# Patient Record
Sex: Female | Born: 1955 | Race: White | Hispanic: No | Marital: Married | Smoking: Never smoker
Health system: Southern US, Community
[De-identification: ages and names within clinical notes are randomized; demographics above are authoritative.]

## PROBLEM LIST (undated history)

## (undated) DIAGNOSIS — H9191 Unspecified hearing loss, right ear: Secondary | ICD-10-CM

## (undated) DIAGNOSIS — E119 Type 2 diabetes mellitus without complications: Secondary | ICD-10-CM

## (undated) DIAGNOSIS — H409 Unspecified glaucoma: Secondary | ICD-10-CM

## (undated) DIAGNOSIS — D649 Anemia, unspecified: Secondary | ICD-10-CM

## (undated) DIAGNOSIS — I10 Essential (primary) hypertension: Secondary | ICD-10-CM

## (undated) DIAGNOSIS — M1990 Unspecified osteoarthritis, unspecified site: Secondary | ICD-10-CM

## (undated) DIAGNOSIS — C189 Malignant neoplasm of colon, unspecified: Secondary | ICD-10-CM

## (undated) DIAGNOSIS — R7303 Prediabetes: Secondary | ICD-10-CM

## (undated) DIAGNOSIS — K602 Anal fissure, unspecified: Secondary | ICD-10-CM

## (undated) HISTORY — DX: Anal fissure, unspecified: K60.2

## (undated) HISTORY — PX: MENISCUS REPAIR: SHX5179

## (undated) HISTORY — DX: Unspecified glaucoma: H40.9

## (undated) HISTORY — DX: Type 2 diabetes mellitus without complications: E11.9

## (undated) HISTORY — DX: Anemia, unspecified: D64.9

## (undated) HISTORY — DX: Unspecified osteoarthritis, unspecified site: M19.90

## (undated) HISTORY — DX: Essential (primary) hypertension: I10

---
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
## 1978-09-18 HISTORY — PX: TUBAL LIGATION: SHX77

## 2004-08-17 ENCOUNTER — Ambulatory Visit: Payer: Self-pay | Admitting: Family Medicine

## 2004-08-31 ENCOUNTER — Ambulatory Visit: Payer: Self-pay | Admitting: Family Medicine

## 2004-09-22 ENCOUNTER — Ambulatory Visit: Payer: Self-pay | Admitting: Family Medicine

## 2017-12-25 ENCOUNTER — Encounter: Payer: Self-pay | Admitting: Gastroenterology

## 2018-01-25 ENCOUNTER — Ambulatory Visit (INDEPENDENT_AMBULATORY_CARE_PROVIDER_SITE_OTHER): Payer: BLUE CROSS/BLUE SHIELD | Admitting: Gastroenterology

## 2018-01-25 ENCOUNTER — Encounter: Payer: Self-pay | Admitting: Gastroenterology

## 2018-01-25 VITALS — BP 126/84 | Ht 66.0 in | Wt 172.0 lb

## 2018-01-25 DIAGNOSIS — Z1211 Encounter for screening for malignant neoplasm of colon: Secondary | ICD-10-CM

## 2018-01-25 MED ORDER — SOD PICOSULFATE-MAG OX-CIT ACD 10-3.5-12 MG-GM -GM/160ML PO SOLN: 1.0000 | Freq: Once | ORAL | 0 refills | Status: AC

## 2018-01-25 NOTE — Patient Instructions (Signed)
If you are age 64 or older, your body mass index should be between 23-30. Your Body mass index is 27.76 kg/m. If this is out of the aforementioned range listed, please consider follow up with your Primary Care Provider.  If you are age 68 or younger, your body mass index should be between 19-25. Your Body mass index is 27.76 kg/m. If this is out of the aformentioned range listed, please consider follow up with your Primary Care Provider.   You have been scheduled for a colonoscopy. Please follow written instructions given to you at your visit today.  Please pick up your prep supplies at the pharmacy within the next 1-3 days. If you use inhalers (even only as needed), please bring them with you on the day of your procedure. Your physician has requested that you go to www.startemmi.com and enter the access code given to you at your visit today. This web site gives a general overview about your procedure. However, you should still follow specific instructions given to you by our office regarding your preparation for the procedure.  We have sent the following medications to your pharmacy for you to pick up at your convenience: Clenpiq  Thank you,  Dr. Jackquline Denmark

## 2018-01-25 NOTE — Progress Notes (Signed)
Chief Complaint: For screening  Referring Provider:  No ref. provider found      ASSESSMENT AND PLAN;   #1. Colorectal cancer screening  #2. FH of polyps (age 62)  - Proceed with colonoscopy.  I have discussed the risks and benefits.  The risks including risk of perforation requiring laparotomy, bleeding after polypectomy requiring blood transfusions and risks of anesthesia/sedation were discussed.  Rare risks of missing colorectal neoplasms were also discussed.  Alternatives were given.  Patient is fully aware and agrees to proceed. All the questions were answered. Colonoscopy will be scheduled in upcoming days.  Patient is to report immediately if there is any significant weight loss or excessive bleeding until then. Consent forms were given for review.    HPI:   62yr old  No nausea, vomiting, heartburn, regurgitation, odynophagia or dysphagia.  No significant diarrhea or constipation.  There is no melena or hematochezia. No unintentional weight loss.  Long standing constipation, better since she had divorce, occ pellet like stools with rare blood over those, attributed to hoids.  Occ Bloating.  Advised screening colonoscopy.  Her dad had polyps around this age.   Past Medical History:  Diagnosis Date  . Anal fissure s/p silver nit 1965  . Anemia   . Hypertension     Past Surgical History:  Procedure Laterality Date  . APPENDECTOMY  1974  . MENISCUS REPAIR Right   . TUBAL LIGATION  1980    Family History  Problem Relation Age of Onset  . Heart disease Mother   . Colon polyps Father   . Cirrhosis Father     Social History   Tobacco Use  . Smoking status: Never Smoker  . Smokeless tobacco: Never Used  Substance Use Topics  . Alcohol use: Not Currently  . Drug use: Never    Current Outpatient Medications  Medication Sig Dispense Refill  . lisinopril-hydrochlorothiazide (PRINZIDE,ZESTORETIC) 20-12.5 MG tablet Take 1 tablet by mouth daily.     No  current facility-administered medications for this visit.     Allergies  Allergen Reactions  . Codeine Nausea Only    Review of Systems:  Constitutional: Denies fever, chills, diaphoresis, appetite change and fatigue.  HEENT: Denies photophobia, eye pain, redness, hearing loss, ear pain, congestion, sore throat, rhinorrhea, sneezing, mouth sores, neck pain, neck stiffness and tinnitus.   Respiratory: Denies SOB, DOE, cough, chest tightness,  and wheezing.   Cardiovascular: Denies chest pain, palpitations and leg swelling.  Genitourinary: Denies dysuria, urgency, frequency, hematuria, flank pain and difficulty urinating.  Musculoskeletal: Denies myalgias, back pain, joint swelling, arthralgias and gait problem.  Skin: No rash.  Neurological: Denies dizziness, seizures, syncope, weakness, light-headedness, numbness and headaches.  Hematological: Denies adenopathy. Easy bruising, personal or family bleeding history  Psychiatric/Behavioral: No anxiety or depression     Physical Exam:    BP 126/84   Ht 5\' 6"  (1.676 m)   Wt 172 lb (78 kg)   BMI 27.76 kg/m  Filed Weights   01/25/18 1335  Weight: 172 lb (78 kg)   Constitutional:  Well-developed, in no acute distress. Psychiatric: Normal mood and affect. Behavior is normal. HEENT: Pupils normal.  Conjunctivae are normal. No scleral icterus. Neck supple.  Cardiovascular: Normal rate, regular rhythm. No edema Pulmonary/chest: Effort normal and breath sounds normal. No wheezing, rales or rhonchi. Abdominal: Soft, nondistended. Nontender. Bowel sounds active throughout. There are no masses palpable. No hepatomegaly. Rectal:  defered Neurological: Alert and oriented to person place and time. Skin:  Skin is warm and dry. No rashes noted.   Carmell Austria, MD   Cc: No ref. provider found

## 2018-02-04 ENCOUNTER — Encounter: Payer: Self-pay | Admitting: Gastroenterology

## 2018-02-15 ENCOUNTER — Encounter: Payer: Self-pay | Admitting: Gastroenterology

## 2018-02-15 ENCOUNTER — Other Ambulatory Visit: Payer: Self-pay

## 2018-02-15 ENCOUNTER — Ambulatory Visit (AMBULATORY_SURGERY_CENTER): Payer: BLUE CROSS/BLUE SHIELD | Admitting: Gastroenterology

## 2018-02-15 VITALS — BP 121/66 | HR 77 | Temp 97.8°F | Resp 19 | Ht 66.0 in | Wt 172.0 lb

## 2018-02-15 DIAGNOSIS — D125 Benign neoplasm of sigmoid colon: Secondary | ICD-10-CM

## 2018-02-15 DIAGNOSIS — K635 Polyp of colon: Secondary | ICD-10-CM

## 2018-02-15 DIAGNOSIS — D122 Benign neoplasm of ascending colon: Secondary | ICD-10-CM | POA: Diagnosis not present

## 2018-02-15 DIAGNOSIS — Z1211 Encounter for screening for malignant neoplasm of colon: Secondary | ICD-10-CM | POA: Diagnosis present

## 2018-02-15 DIAGNOSIS — D128 Benign neoplasm of rectum: Secondary | ICD-10-CM

## 2018-02-15 DIAGNOSIS — C19 Malignant neoplasm of rectosigmoid junction: Secondary | ICD-10-CM | POA: Diagnosis not present

## 2018-02-15 DIAGNOSIS — K6389 Other specified diseases of intestine: Secondary | ICD-10-CM

## 2018-02-15 MED ORDER — SODIUM CHLORIDE 0.9 % IV SOLN: 500.0000 mL | Freq: Once | INTRAVENOUS | Status: DC

## 2018-02-15 NOTE — Progress Notes (Signed)
Called to room to assist during endoscopic procedure.  Patient ID and intended procedure confirmed with present staff. Received instructions for my participation in the procedure from the performing physician.  

## 2018-02-15 NOTE — Progress Notes (Signed)
To recovery, report to RN, VSS. 

## 2018-02-15 NOTE — Op Note (Signed)
[facility] Patient Name: Sandra Mckee Procedure Date: 02/15/2018 3:32 PM MRN: [id number] Endoscopist: Jackquline Denmark , MD Age: 62 Referring MD:  Date of Birth: Feb 24, 1956 Gender: Female Account #: [id number] Procedure:                Colonoscopy Indications:              Screening for colorectal malignant neoplasm Medicines:                Monitored Anesthesia Care Procedure:                Pre-Anesthesia Assessment:                           - Prior to the procedure, a History and Physical                            was performed, and patient medications and                            allergies were reviewed. The patient is competent.                            The risks and benefits of the procedure and the                            sedation options and risks were discussed with the                            patient. All questions were answered and informed                            consent was obtained. Patient identification and                            proposed procedure were verified by the physician                            in the procedure room. Mental Status Examination:                            alert and oriented. Prophylactic Antibiotics: The                            patient does not require prophylactic antibiotics.                            Prior Anticoagulants: The patient has taken no                            previous anticoagulant or antiplatelet agents. ASA                            Grade Assessment: II - A patient with mild systemic  disease. After reviewing the risks and benefits,                            the patient was deemed in satisfactory condition to                            undergo the procedure. The anesthesia plan was to                            use monitored anesthesia care (MAC). Immediately                            prior to administration of medications, the patient                             was re-assessed for adequacy to receive sedatives.                            The heart rate, respiratory rate, oxygen                            saturations, blood pressure, adequacy of pulmonary                            ventilation, and response to care were monitored                            throughout the procedure. The physical status of                            the patient was re-assessed after the procedure.                           After obtaining informed consent, the colonoscope                            was passed under direct vision. Throughout the                            procedure, the patient's blood pressure, pulse, and                            oxygen saturations were monitored continuously. The                            Colonoscope was introduced through the anus and                            advanced to the the cecum, identified by                            appendiceal orifice and ileocecal valve. The  colonoscopy was performed without difficulty. The                            patient tolerated the procedure well. The quality                            of the bowel preparation was good. Scope In: 3:45:04 PM Scope Out: 4:11:19 PM Scope Withdrawal Time: 0 hours 19 minutes 57 seconds  Total Procedure Duration: 0 hours 26 minutes 15 seconds  Findings:                 An ulcerated partially obstructing, 4 cm                            circumferential was found in the recto-sigmoid                            colon extending from 15 cm to 19 cm. The pediatric                            colonoscope (with diameter 11.2 mm could barely be                            passed beyond). This was friable. This was biopsied                            with a cold forceps for histology. Distal margin                            was tattooed with an injection of 3 mL of [place]                            ink. The mass was friable and would bleed  easily.                           A 4 mm polyp was found in the ascending colon. The                            polyp was sessile. The polyp was removed with a                            cold biopsy forceps. Resection and retrieval were                            complete.                           A 6 mm polyp was found in the sigmoid colon. The                            polyp was sessile. The polyp was removed with a  cold snare. Resection and retrieval were complete.                           A 10 mm polyp was found in the rectum. The polyp                            was sessile. The polyp was removed with a hot                            snare. Resection and retrieval were complete.                           A few small-mouthed diverticula were found in the                            sigmoid colon.                           Non-bleeding internal hemorrhoids were found. The                            hemorrhoids were small. Complications:            No immediate complications. Estimated Blood Loss:     Estimated blood loss was minimal. Impression:               - Malignant Recto-sigmoid mass. (Biopsied and                            Tattooed)                           - Colonic polyps status post polypectomy.                           - Mild sigmoid diverticulosis                           - Non-bleeding internal hemorrhoids. Recommendation:           - Resume previous diet.                           - Continue present medications.                           - Await pathology results.                           - Once biopsies are back, will check CBC, CMP, CEA.                            Followed by CT scan of the chest abdomen and                            pelvis. She would also need oncology and surgical  consultation. Jackquline Denmark, MD 02/15/2018 4:31:03 PM This report has been signed electronically.

## 2018-02-15 NOTE — Patient Instructions (Signed)
YOU HAD AN ENDOSCOPIC PROCEDURE TODAY AT [facility]:   Refer to the procedure report that was given to you for any specific questions about what was found during the examination.  If the procedure report does not answer your questions, please call your gastroenterologist to clarify.  If you requested that your care partner not be given the details of your procedure findings, then the procedure report has been included in a sealed envelope for you to review at your convenience later.  YOU SHOULD EXPECT: Some feelings of bloating in the abdomen. Passage of more gas than usual.  Walking can help get rid of the air that was put into your GI tract during the procedure and reduce the bloating. If you had a lower endoscopy (such as a colonoscopy or flexible sigmoidoscopy) you may notice spotting of blood in your stool or on the toilet paper. If you underwent a bowel prep for your procedure, you may not have a normal bowel movement for a few days.  Please Note:  You might notice some irritation and congestion in your nose or some drainage.  This is from the oxygen used during your procedure.  There is no need for concern and it should clear up in a day or so.  SYMPTOMS TO REPORT IMMEDIATELY:   Following lower endoscopy (colonoscopy or flexible sigmoidoscopy):  Excessive amounts of blood in the stool  Significant tenderness or worsening of abdominal pains  Swelling of the abdomen that is new, acute  Fever of 100F or higher   For urgent or emergent issues, a gastroenterologist can be reached at any hour by calling [phone].   DIET:  We do recommend a small meal at first, but then you may proceed to your regular diet.  Drink plenty of fluids but you should avoid alcoholic beverages for 24 hours.  ACTIVITY:  You should plan to take it easy for the rest of today and you should NOT DRIVE or use heavy machinery until tomorrow (because of the sedation medicines used during the test).     FOLLOW UP: Our staff will call the number listed on your records the next business day following your procedure to check on you and address any questions or concerns that you may have regarding the information given to you following your procedure. If we do not reach you, we will leave a message.  However, if you are feeling well and you are not experiencing any problems, there is no need to return our call.  We will assume that you have returned to your regular daily activities without incident.  If any biopsies were taken you will be contacted by phone or by letter within the next 1-3 weeks.  Please call us at [phone] if you have not heard about the biopsies in 3 weeks.    SIGNATURES/CONFIDENTIALITY: You and/or your care partner have signed paperwork which will be entered into your electronic medical record.  These signatures attest to the fact that that the information above on your After Visit Summary has been reviewed and is understood.  Full responsibility of the confidentiality of this discharge information lies with you and/or your care-partner.  We will see what the next steps are before we take any other action.

## 2018-02-15 NOTE — Progress Notes (Signed)
Specimens sent [facility] called at [facility].

## 2018-02-18 ENCOUNTER — Other Ambulatory Visit: Payer: Self-pay

## 2018-02-18 ENCOUNTER — Telehealth: Payer: Self-pay

## 2018-02-18 DIAGNOSIS — K6389 Other specified diseases of intestine: Secondary | ICD-10-CM

## 2018-02-18 NOTE — Telephone Encounter (Signed)
  Follow up Call-  Call back number 02/15/2018  Post procedure Call Back phone  # [phone]  Permission to leave phone message Yes  Some recent data might be hidden     Patient questions:  Do you have a fever, pain , or abdominal swelling? No. Pain Score  0 *  Have you tolerated food without any problems? Yes.    Have you been able to return to your normal activities? Yes.    Do you have any questions about your discharge instructions: Diet   No. Medications  No. Follow up visit  No.  Do you have questions or concerns about your Care? Yes.    Actions: * If pain score is 4 or above: No action needed, pain <4.  Pt. Did wonder when she would hear back from her biopsy results.   Told pt. If she has not heard by Friday morning, to call and check.

## 2018-02-18 NOTE — Telephone Encounter (Signed)
Pt is returning your call and said she is doing good °

## 2018-02-18 NOTE — Telephone Encounter (Signed)
Left message

## 2018-02-19 ENCOUNTER — Telehealth: Payer: Self-pay | Admitting: Gastroenterology

## 2018-02-19 NOTE — Telephone Encounter (Signed)
Left message yesterday and today to call back regarding pathology. Have left my cell phone no as well

## 2018-02-20 ENCOUNTER — Other Ambulatory Visit: Payer: Self-pay

## 2018-02-20 ENCOUNTER — Telehealth: Payer: Self-pay

## 2018-02-20 DIAGNOSIS — K6389 Other specified diseases of intestine: Secondary | ICD-10-CM

## 2018-02-20 NOTE — Progress Notes (Signed)
che

## 2018-02-20 NOTE — Telephone Encounter (Signed)
CT scans ordered for 02/28/18 at 10:00 am with arrival at 9:45.  She is to have lab done here and will pick up the contrast here at the office.  She verbalized understanding of the instructions, first bottle at 8am and second at 9am, NPO for 4 hours prior to test.

## 2018-02-22 ENCOUNTER — Other Ambulatory Visit (INDEPENDENT_AMBULATORY_CARE_PROVIDER_SITE_OTHER): Payer: BLUE CROSS/BLUE SHIELD

## 2018-02-22 DIAGNOSIS — K639 Disease of intestine, unspecified: Secondary | ICD-10-CM | POA: Diagnosis not present

## 2018-02-22 DIAGNOSIS — K6389 Other specified diseases of intestine: Secondary | ICD-10-CM

## 2018-02-22 LAB — CBC WITH DIFFERENTIAL/PLATELET
BASOS ABS: 0 10*3/uL (ref 0.0–0.1)
BASOS PCT: 0.6 % (ref 0.0–3.0)
EOS ABS: 0.3 10*3/uL (ref 0.0–0.7)
Eosinophils Relative: 5.3 % — ABNORMAL HIGH (ref 0.0–5.0)
HCT: 43 % (ref 36.0–46.0)
HEMOGLOBIN: 14.6 g/dL (ref 12.0–15.0)
LYMPHS PCT: 21.8 % (ref 12.0–46.0)
Lymphs Abs: 1.4 10*3/uL (ref 0.7–4.0)
MCHC: 33.9 g/dL (ref 30.0–36.0)
MCV: 91.1 fl (ref 78.0–100.0)
MONO ABS: 0.6 10*3/uL (ref 0.1–1.0)
Monocytes Relative: 9.5 % (ref 3.0–12.0)
Neutro Abs: 4 10*3/uL (ref 1.4–7.7)
Neutrophils Relative %: 62.8 % (ref 43.0–77.0)
Platelets: 257 10*3/uL (ref 150.0–400.0)
RBC: 4.72 Mil/uL (ref 3.87–5.11)
RDW: 13.9 % (ref 11.5–15.5)
WBC: 6.3 10*3/uL (ref 4.0–10.5)

## 2018-02-22 LAB — COMPREHENSIVE METABOLIC PANEL
ALBUMIN: 4.4 g/dL (ref 3.5–5.2)
ALK PHOS: 80 U/L (ref 39–117)
ALT: 18 U/L (ref 0–35)
AST: 39 U/L — ABNORMAL HIGH (ref 0–37)
BILIRUBIN TOTAL: 0.4 mg/dL (ref 0.2–1.2)
BUN: 19 mg/dL (ref 6–23)
CALCIUM: 9.8 mg/dL (ref 8.4–10.5)
CHLORIDE: 101 meq/L (ref 96–112)
CO2: 27 mEq/L (ref 19–32)
CREATININE: 0.88 mg/dL (ref 0.40–1.20)
GFR: 68.75 mL/min (ref >60.00)
Glucose, Bld: 187 mg/dL — ABNORMAL HIGH (ref 70–99)
Potassium: 4.9 mEq/L (ref 3.5–5.1)
Sodium: 138 mEq/L (ref 135–145)
TOTAL PROTEIN: 7.2 g/dL (ref 6.0–8.3)

## 2018-02-25 LAB — CEA: CEA: 1.4 ng/mL

## 2018-02-26 ENCOUNTER — Inpatient Hospital Stay: Payer: BLUE CROSS/BLUE SHIELD

## 2018-02-26 ENCOUNTER — Inpatient Hospital Stay: Payer: BLUE CROSS/BLUE SHIELD | Attending: Hematology & Oncology | Admitting: Hematology & Oncology

## 2018-02-26 ENCOUNTER — Other Ambulatory Visit: Payer: Self-pay

## 2018-02-26 ENCOUNTER — Encounter: Payer: Self-pay | Admitting: Hematology & Oncology

## 2018-02-26 VITALS — BP 113/68 | HR 74 | Temp 98.5°F | Wt 170.8 lb

## 2018-02-26 DIAGNOSIS — C19 Malignant neoplasm of rectosigmoid junction: Secondary | ICD-10-CM | POA: Insufficient documentation

## 2018-02-26 DIAGNOSIS — I10 Essential (primary) hypertension: Secondary | ICD-10-CM | POA: Diagnosis not present

## 2018-02-26 DIAGNOSIS — D123 Benign neoplasm of transverse colon: Secondary | ICD-10-CM

## 2018-02-26 DIAGNOSIS — Z8371 Family history of colonic polyps: Secondary | ICD-10-CM | POA: Diagnosis not present

## 2018-02-26 LAB — CMP (CANCER CENTER ONLY)
ALBUMIN: 4.4 g/dL (ref 3.5–5.0)
ALT: 16 U/L (ref 0–55)
ANION GAP: 9 (ref 3–11)
AST: 37 U/L — ABNORMAL HIGH (ref 5–34)
Alkaline Phosphatase: 91 U/L (ref 40–150)
BILIRUBIN TOTAL: 0.4 mg/dL (ref 0.2–1.2)
BUN: 20 mg/dL (ref 7–26)
CO2: 26 mmol/L (ref 22–29)
Calcium: 10.5 mg/dL — ABNORMAL HIGH (ref 8.4–10.4)
Chloride: 103 mmol/L (ref 98–109)
Creatinine: 0.82 mg/dL (ref 0.60–1.10)
GFR, Est AFR Am: >60 mL/min (ref >60)
GFR, Estimated: >60 mL/min (ref >60)
GLUCOSE: 112 mg/dL (ref 70–140)
POTASSIUM: 4.7 mmol/L (ref 3.5–5.1)
SODIUM: 138 mmol/L (ref 136–145)
TOTAL PROTEIN: 7.5 g/dL (ref 6.4–8.3)

## 2018-02-26 LAB — CBC WITH DIFFERENTIAL (CANCER CENTER ONLY)
BASOS ABS: 0 10*3/uL (ref 0.0–0.1)
BASOS PCT: 1 %
Eosinophils Absolute: 0.2 10*3/uL (ref 0.0–0.5)
Eosinophils Relative: 3 %
HEMATOCRIT: 42.9 % (ref 34.8–46.6)
Hemoglobin: 14.4 g/dL (ref 11.6–15.9)
Lymphocytes Relative: 25 %
Lymphs Abs: 1.9 10*3/uL (ref 0.9–3.3)
MCH: 30.9 pg (ref 26.0–34.0)
MCHC: 33.6 g/dL (ref 32.0–36.0)
MCV: 92.1 fL (ref 81.0–101.0)
MONO ABS: 0.7 10*3/uL (ref 0.1–0.9)
Monocytes Relative: 8 %
NEUTROS ABS: 5 10*3/uL (ref 1.5–6.5)
NEUTROS PCT: 63 %
Platelet Count: 264 10*3/uL (ref 145–400)
RBC: 4.66 MIL/uL (ref 3.70–5.32)
RDW: 13.4 % (ref 11.1–15.7)
WBC Count: 7.8 10*3/uL (ref 3.9–10.0)

## 2018-02-27 ENCOUNTER — Telehealth: Payer: Self-pay | Admitting: *Deleted

## 2018-02-27 LAB — CEA (IN HOUSE-CHCC): CEA (CHCC-IN HOUSE): 1.38 ng/mL (ref 0.00–5.00)

## 2018-02-27 NOTE — Telephone Encounter (Signed)
Per Morton Amy, her CT scans tomorrow do not require a prior authorization.

## 2018-02-27 NOTE — Telephone Encounter (Addendum)
Patient is aware of results  ----- Message from [name], MD sent at 02/27/2018  1:42 PM EDT ----- Call - the tumor level is normal!! I do not think that the cancer has spread!!  Sandra Mckee

## 2018-02-27 NOTE — Progress Notes (Signed)
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 Follow-up  : I have colon cancer.  HPI: Ms. Kerwood is a very nice 62 year old white female.  She really has been quite healthy.  She does have some hyper tension.  She has been having some rectal bleeding for about 4 months.  She has had no pain associated with this.  Is been no weight loss.  She has had no nausea or vomiting.  She ultimately had a colonoscopy.  She has never had a colonoscopy before.  The colonoscopy was done on Feb 15, 2018.  Polyps were noted.  She was noted to have a mass in the rectosigmoid colon.  The pathology report ([id number]) showed fragment of adenocarcinoma in a polyp in the mid sigmoid colon.  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  She is yet to have a CT scan done.  She has not yet seen surgery.  She does not smoke.  There is been no obvious change in bowel or bladder habits.  She has had no diarrhea.  She has had no hematuria.  She has had some occasional urinary urgency.  She is had no leg swelling.  She is had no rashes.  There is been no fever.  There is no history of diabetes.  There is no family history of colon cancer.  She was referred to the [facility] for an evaluation.  Overall, her performance status is ECOG 0.   Past Medical History:  Diagnosis Date  . Anal fissure 1965  . Anemia   . Arthritis    fingers, hand, lower back  . Glaucoma   . Hypertension   :  Past Surgical History:  Procedure Laterality Date  . APPENDECTOMY  1974  . MENISCUS REPAIR Right   . TUBAL LIGATION  1980  :   Current Outpatient Medications:  .  lisinopril-hydrochlorothiazide (PRINZIDE,ZESTORETIC) 20-12.5 MG tablet, Take 1 tablet by mouth daily., Disp: , Rfl:   Current Facility-Administered Medications:  .  0.9 %  sodium chloride infusion, 500 mL, Intravenous, Once,  Jackquline Denmark, MD:  :  Allergies  Allergen Reactions  . Codeine Nausea Only  :  Family History  Problem Relation Age of Onset  . Heart disease Mother   . Colon polyps Father   . Cirrhosis Father   . Colon cancer Neg Hx   . Esophageal cancer Neg Hx   . Prostate cancer Neg Hx   . Rectal cancer Neg Hx   :  Social History   Socioeconomic History  . Marital status: Married    Spouse name: Not on file  . Number of children: Not on file  . Years of education: Not on file  . Highest education level: Not on file  Occupational History  . Not on file  Social Needs  . Financial resource strain: Not on file  . Food insecurity:    Worry: Not on file    Inability: Not on file  . Transportation needs:    Medical: Not on file    Non-medical: Not on file  Tobacco Use  . Smoking status: Never Smoker  . Smokeless tobacco: Never Used  Substance and Sexual Activity  . Alcohol use: Not Currently  . Drug use: Never  . Sexual activity: Not on file  Lifestyle  . Physical activity:    Days per week: Not on file    Minutes per  session: Not on file  . Stress: Not on file  Relationships  . Social connections:    Talks on phone: Not on file    Gets together: Not on file    Attends religious service: Not on file    Active member of club or organization: Not on file    Attends meetings of clubs or organizations: Not on file    Relationship status: Not on file  . Intimate partner violence:    Fear of current or ex partner: Not on file    Emotionally abused: Not on file    Physically abused: Not on file    Forced sexual activity: Not on file  Other Topics Concern  . Not on file  Social History Narrative  . Not on file  :  Review of Systems  Constitutional: Negative.   HENT: Negative.   Eyes: Negative.   Respiratory: Negative.   Cardiovascular: Negative.   Gastrointestinal: Positive for blood in stool.  Genitourinary: Positive for frequency.  Musculoskeletal: Negative.    Skin: Negative.   Neurological: Negative.   Endo/Heme/Allergies: Negative.   Psychiatric/Behavioral: Negative.      Exam: Well-developed well-nourished white female in no obvious distress.  Vital signs show temperature of 98.5.  Pulse 74.  Blood pressure 113/68.  Weight is 171 pounds.  Head neck exam shows no ocular or oral lesions.  There are no palpable cervical or supraclavicular lymph nodes.  Lungs are clear bilaterally.  Cardiac exam regular rate and rhythm with no murmurs, rubs or bruits.  Abdomen is soft.  She has good bowel sounds.  There is no fluid wave.  There is no guarding or rebound tenderness.  She has no palpable liver or spleen tip.  Back exam shows no tenderness over the spine, ribs or hips.  Extremities shows no clubbing, cyanosis or edema.  Skin exam shows no rashes, ecchymoses or petechia.  Neurological exam shows no focal neurological deficits. @IPVITALS @   Recent Labs    02/26/18 1211  WBC 7.8  HGB 14.4  HCT 42.9  PLT 264   Recent Labs    02/26/18 1211  NA 138  K 4.7  CL 103  CO2 26  GLUCOSE 112  BUN 20  CREATININE 0.82  CALCIUM 10.5*    Blood smear review: None  Pathology: See above    Assessment and Plan: Ms. Boling is a very charming 62 year old white female.  She has invasive adenocarcinoma of the rectosigmoid colon.  She has had polyps.  She comes in with her daughter.  Thankfully, we can learn from this.  Her daughter will clearly need to have a colonoscopy before she is 25 in my opinion.  She clearly needs surgery.  She will see the surgeon next week.  I think she is scheduled for a CT scan on Friday.  I would not think that there is any evidence of metastatic disease by her exam and by her labs.  We will have to see what her CEA level is.  The issue of adjuvant therapy will really be dictated by the pathology.  Hopefully, she will not need any adjuvant therapy.  I am amazed that she does not have any symptoms from this tumor outside of  some hematochezia.  By her lack of anemia, the amount of bleeding must be minimal.  We will await the surgical intervention.  Hopefully, she will be able to have surgery within a couple weeks.  I spent a good hour with Ms. Noori and her  daughter.  I spent all the time face-to-face with them. I was counseling them as to the findings and my recommendations.  They understand very well.  She is in great shape so I do not see any problems with being aggressive with surgery.  I likely will see her in the hospital after she has surgery.

## 2018-02-28 ENCOUNTER — Ambulatory Visit (INDEPENDENT_AMBULATORY_CARE_PROVIDER_SITE_OTHER)
Admission: RE | Admit: 2018-02-28 | Discharge: 2018-02-28 | Disposition: A | Discharge status: Home / Self Care | Payer: BLUE CROSS/BLUE SHIELD | Source: Ambulatory Visit | Attending: Gastroenterology | Admitting: Gastroenterology

## 2018-02-28 DIAGNOSIS — K639 Disease of intestine, unspecified: Secondary | ICD-10-CM | POA: Diagnosis not present

## 2018-02-28 DIAGNOSIS — K6389 Other specified diseases of intestine: Secondary | ICD-10-CM

## 2018-02-28 MED ORDER — IOPAMIDOL (ISOVUE-300) INJECTION 61%
100.0000 mL | Freq: Once | INTRAVENOUS | Status: AC | PRN
  Administered 2018-02-28: 100 mL via INTRAVENOUS

## 2018-03-01 ENCOUNTER — Telehealth: Payer: Self-pay | Admitting: Gastroenterology

## 2018-03-01 NOTE — Telephone Encounter (Signed)
I have discussed the results of the CT scan chest abdomen and pelvis with the patient in detail.  I also reviewed the CT myself.  No evidence of any metastatic disease.  Patient has appointment with surgery next week.  She has already seen Dr Marin Olp. She was very happy with Dr. Antonieta Pert visit

## 2018-03-04 ENCOUNTER — Ambulatory Visit: Payer: Self-pay | Admitting: General Surgery

## 2018-03-04 NOTE — H&P (View-Only) (Signed)
History of Present Illness Leighton Ruff MD; 0/48/8891 9:51 AM) The patient is a 62 year old female who presents with colorectal cancer. 62 year old female who presents to the office with complaints of bloody bowel movements. Colonoscopy revealed a rectosigmoid mass and several colon polyps. The mass was able to be traversed with a repeat scope only. Biopsies confirmed adenocarcinoma. CT scans of the chest abdomen and pelvis showed some thickening around the cecum thought to be due to her previous appendectomy. There was also some thickening around the gallbladder. Otherwise mass was shown in the rectosigmoid junction. There were some slightly dilated lymph nodes in the area. She is slightly symptomatic with some mild abdominal cramping. She is having no more bloody bowel movements. She denies any weight loss. Her only surgery is an open appendectomy approximately 35 years ago.   Past Surgical History (Tanisha A. Owens Shark, [place]; 03/04/2018 9:22 AM) Anal Fissure Repair Appendectomy Colon Polyp Removal - Colonoscopy Knee Surgery Right. Oral Surgery  Diagnostic Studies History (Tanisha A. Owens Shark, [place]; 03/04/2018 9:22 AM) Colonoscopy within last year Mammogram never Pap Smear 1-5 years ago  Allergies (Tanisha A. Owens Shark, RMA; 03/04/2018 9:23 AM) Codeine Phosphate *ANALGESICS - OPIOID* Nausea, Vomiting. Allergies Reconciled  Medication History (Tanisha A. Owens Shark, [place]; 03/04/2018 9:23 AM) Lisinopril-Hydrochlorothiazide (20-12.5MG  Tablet, Oral) Active. Medications Reconciled  Social History (Tanisha A. Owens Shark, [place]; 03/04/2018 9:22 AM) Alcohol use Occasional alcohol use. Caffeine use Tea. No drug use Tobacco use Never smoker.  Family History (Tanisha A. Owens Shark, [place]; 03/04/2018 9:22 AM) Colon Polyps Father. Diabetes Mellitus Sister. Heart Disease Mother. Heart disease in female family member before age 22 Hypertension Mother.  Pregnancy / Birth History (Tanisha A. Owens Shark,  [place]; 03/04/2018 9:22 AM) Age at menarche 75 years. Age of menopause 51-55 Contraceptive History Oral contraceptives. Gravida 2 Length (months) of breastfeeding 3-6 Maternal age 73-20 Para 2  Other Problems (Tanisha A. Owens Shark, [place]; 03/04/2018 9:22 AM) Back Pain Bladder Problems Hemorrhoids High blood pressure     Review of Systems (Tanisha A. Brown RMA; 03/04/2018 9:22 AM) General Not Present- Appetite Loss, Chills, Fatigue, Fever, Night Sweats, Weight Gain and Weight Loss. Skin Not Present- Change in Wart/Mole, Dryness, Hives, Jaundice, New Lesions, Non-Healing Wounds, Rash and Ulcer. HEENT Present- Hearing Loss. Not Present- Earache, Hoarseness, Nose Bleed, Oral Ulcers, Ringing in the Ears, Seasonal Allergies, Sinus Pain, Sore Throat, Visual Disturbances, Wears glasses/contact lenses and Yellow Eyes. Respiratory Not Present- Bloody sputum, Chronic Cough, Difficulty Breathing, Snoring and Wheezing. Breast Not Present- Breast Mass, Breast Pain, Nipple Discharge and Skin Changes. Cardiovascular Not Present- Chest Pain, Difficulty Breathing Lying Down, Leg Cramps, Palpitations, Rapid Heart Rate, Shortness of Breath and Swelling of Extremities. Gastrointestinal Present- Indigestion. Not Present- Abdominal Pain, Bloating, Bloody Stool, Change in Bowel Habits, Chronic diarrhea, Constipation, Difficulty Swallowing, Excessive gas, Gets full quickly at meals, Hemorrhoids, Nausea, Rectal Pain and Vomiting. Female Genitourinary Present- Pelvic Pain. Not Present- Frequency, Nocturia, Painful Urination and Urgency. Musculoskeletal Present- Back Pain. Not Present- Joint Pain, Joint Stiffness, Muscle Pain, Muscle Weakness and Swelling of Extremities. Neurological Not Present- Decreased Memory, Fainting, Headaches, Numbness, Seizures, Tingling, Tremor, Trouble walking and Weakness. Psychiatric Not Present- Anxiety, Bipolar, Change in Sleep Pattern, Depression, Fearful and Frequent  crying. Endocrine Not Present- Cold Intolerance, Excessive Hunger, Hair Changes, Heat Intolerance, Hot flashes and New Diabetes. Hematology Not Present- Blood Thinners, Easy Bruising, Excessive bleeding, Gland problems, HIV and Persistent Infections.  Vitals (Tanisha A. Brown RMA; 03/04/2018 9:23 AM) 03/04/2018 9:22 AM Weight: 173.6 lb Height: 66in Body Surface Area:  1.88 m Body Mass Index: 28.02 kg/m  Temp.: 97.17F  Pulse: 89 (Regular)  BP: 122/78 (Sitting, Left Arm, Standard)      Physical Exam Leighton Ruff MD; 0/37/0488 9:52 AM)  General Mental Status-Alert. General Appearance-Not in acute distress. Build & Nutrition-Well nourished. Posture-Normal posture. Gait-Normal.  Head and Neck Head-normocephalic, atraumatic with no lesions or palpable masses. Trachea-midline.  Chest and Lung Exam Chest and lung exam reveals -on auscultation, normal breath sounds, no adventitious sounds and normal vocal resonance.  Cardiovascular Cardiovascular examination reveals -normal heart sounds, regular rate and rhythm with no murmurs and no digital clubbing, cyanosis, edema, increased warmth or tenderness.  Abdomen Inspection Inspection of the abdomen reveals - No Hernias. Palpation/Percussion Palpation and Percussion of the abdomen reveal - Soft, Non Tender, No Rigidity (guarding), No hepatosplenomegaly and No Palpable abdominal masses.  Rectal Anorectal Exam Internal - Note: no masses palpated.  Neurologic Neurologic evaluation reveals -alert and oriented x 3 with no impairment of recent or remote memory, normal attention span and ability to concentrate, normal sensation and normal coordination.  Musculoskeletal Normal Exam - Bilateral-Upper Extremity Strength Normal and Lower Extremity Strength Normal.    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 This was noted in the rectosigmoid junction. It was tattooed distally. On exam today I cannot palpate it digitally. CT scan showed no signs of metastatic disease. We have discussed surgical resection in detail. We will use a minimally invasive approach. The surgery and anatomy were described to the patient as well as the risks of surgery and the possible complications. These include: Bleeding, deep abdominal infections and possible wound complications such as hernia and infection, damage to adjacent structures, leak of surgical connections, which can lead to other surgeries and possibly an ostomy, possible need for other procedures, such as abscess drains in radiology, possible prolonged hospital stay, possible diarrhea from removal of part of the colon, possible constipation from narcotics, possible bowel, bladder or sexual dysfunction if having rectal surgery, prolonged fatigue/weakness or appetite loss, possible early recurrence of of disease, possible complications of their medical problems such as heart disease or arrhythmias or lung problems, death (less than 1%). I believe the patient understands and wishes to proceed with the surgery.

## 2018-03-04 NOTE — H&P (Signed)
History of Present Illness Leighton Ruff MD; 6/50/3546 9:51 AM) The patient is a 62 year old female who presents with colorectal cancer. 62 year old female who presents to the office with complaints of bloody bowel movements. Colonoscopy revealed a rectosigmoid mass and several colon polyps. The mass was able to be traversed with a repeat scope only. Biopsies confirmed adenocarcinoma. CT scans of the chest abdomen and pelvis showed some thickening around the cecum thought to be due to her previous appendectomy. There was also some thickening around the gallbladder. Otherwise mass was shown in the rectosigmoid junction. There were some slightly dilated lymph nodes in the area. She is slightly symptomatic with some mild abdominal cramping. She is having no more bloody bowel movements. She denies any weight loss. Her only surgery is an open appendectomy approximately 35 years ago.   Past Surgical History (Tanisha A. Owens Shark, [place]; 03/04/2018 9:22 AM) Anal Fissure Repair Appendectomy Colon Polyp Removal - Colonoscopy Knee Surgery Right. Oral Surgery  Diagnostic Studies History (Tanisha A. Owens Shark, [place]; 03/04/2018 9:22 AM) Colonoscopy within last year Mammogram never Pap Smear 1-5 years ago  Allergies (Tanisha A. Owens Shark, RMA; 03/04/2018 9:23 AM) Codeine Phosphate *ANALGESICS - OPIOID* Nausea, Vomiting. Allergies Reconciled  Medication History (Tanisha A. Owens Shark, RMA; 03/04/2018 9:23 AM) Lisinopril-Hydrochlorothiazide (20-12.5MG  Tablet, Oral) Active. Medications Reconciled  Social History (Tanisha A. Owens Shark, [place]; 03/04/2018 9:22 AM) Alcohol use Occasional alcohol use. Caffeine use Tea. No drug use Tobacco use Never smoker.  Family History (Tanisha A. Owens Shark, [place]; 03/04/2018 9:22 AM) Colon Polyps Father. Diabetes Mellitus Sister. Heart Disease Mother. Heart disease in female family member before age 43 Hypertension Mother.  Pregnancy / Birth History (Tanisha A. Owens Shark,  [place]; 03/04/2018 9:22 AM) Age at menarche 48 years. Age of menopause 51-55 Contraceptive History Oral contraceptives. Gravida 2 Length (months) of breastfeeding 3-6 Maternal age 79-20 Para 2  Other Problems (Tanisha A. Owens Shark, [place]; 03/04/2018 9:22 AM) Back Pain Bladder Problems Hemorrhoids High blood pressure     Review of Systems (Tanisha A. Brown RMA; 03/04/2018 9:22 AM) General Not Present- Appetite Loss, Chills, Fatigue, Fever, Night Sweats, Weight Gain and Weight Loss. Skin Not Present- Change in Wart/Mole, Dryness, Hives, Jaundice, New Lesions, Non-Healing Wounds, Rash and Ulcer. HEENT Present- Hearing Loss. Not Present- Earache, Hoarseness, Nose Bleed, Oral Ulcers, Ringing in the Ears, Seasonal Allergies, Sinus Pain, Sore Throat, Visual Disturbances, Wears glasses/contact lenses and Yellow Eyes. Respiratory Not Present- Bloody sputum, Chronic Cough, Difficulty Breathing, Snoring and Wheezing. Breast Not Present- Breast Mass, Breast Pain, Nipple Discharge and Skin Changes. Cardiovascular Not Present- Chest Pain, Difficulty Breathing Lying Down, Leg Cramps, Palpitations, Rapid Heart Rate, Shortness of Breath and Swelling of Extremities. Gastrointestinal Present- Indigestion. Not Present- Abdominal Pain, Bloating, Bloody Stool, Change in Bowel Habits, Chronic diarrhea, Constipation, Difficulty Swallowing, Excessive gas, Gets full quickly at meals, Hemorrhoids, Nausea, Rectal Pain and Vomiting. Female Genitourinary Present- Pelvic Pain. Not Present- Frequency, Nocturia, Painful Urination and Urgency. Musculoskeletal Present- Back Pain. Not Present- Joint Pain, Joint Stiffness, Muscle Pain, Muscle Weakness and Swelling of Extremities. Neurological Not Present- Decreased Memory, Fainting, Headaches, Numbness, Seizures, Tingling, Tremor, Trouble walking and Weakness. Psychiatric Not Present- Anxiety, Bipolar, Change in Sleep Pattern, Depression, Fearful and Frequent  crying. Endocrine Not Present- Cold Intolerance, Excessive Hunger, Hair Changes, Heat Intolerance, Hot flashes and New Diabetes. Hematology Not Present- Blood Thinners, Easy Bruising, Excessive bleeding, Gland problems, HIV and Persistent Infections.  Vitals (Tanisha A. Brown RMA; 03/04/2018 9:23 AM) 03/04/2018 9:22 AM Weight: 173.6 lb Height: 66in Body Surface Area:  1.88 m Body Mass Index: 28.02 kg/m  Temp.: 97.26F  Pulse: 89 (Regular)  BP: 122/78 (Sitting, Left Arm, Standard)      Physical Exam Leighton Ruff MD; 7/00/1749 9:52 AM)  General Mental Status-Alert. General Appearance-Not in acute distress. Build & Nutrition-Well nourished. Posture-Normal posture. Gait-Normal.  Head and Neck Head-normocephalic, atraumatic with no lesions or palpable masses. Trachea-midline.  Chest and Lung Exam Chest and lung exam reveals -on auscultation, normal breath sounds, no adventitious sounds and normal vocal resonance.  Cardiovascular Cardiovascular examination reveals -normal heart sounds, regular rate and rhythm with no murmurs and no digital clubbing, cyanosis, edema, increased warmth or tenderness.  Abdomen Inspection Inspection of the abdomen reveals - No Hernias. Palpation/Percussion Palpation and Percussion of the abdomen reveal - Soft, Non Tender, No Rigidity (guarding), No hepatosplenomegaly and No Palpable abdominal masses.  Rectal Anorectal Exam Internal - Note: no masses palpated.  Neurologic Neurologic evaluation reveals -alert and oriented x 3 with no impairment of recent or remote memory, normal attention span and ability to concentrate, normal sensation and normal coordination.  Musculoskeletal Normal Exam - Bilateral-Upper Extremity Strength Normal and Lower Extremity Strength Normal.    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 This was noted in the rectosigmoid junction. It was tattooed distally. On exam today I cannot palpate it digitally. CT scan showed no signs of metastatic disease. We have discussed surgical resection in detail. We will use a minimally invasive approach. The surgery and anatomy were described to the patient as well as the risks of surgery and the possible complications. These include: Bleeding, deep abdominal infections and possible wound complications such as hernia and infection, damage to adjacent structures, leak of surgical connections, which can lead to other surgeries and possibly an ostomy, possible need for other procedures, such as abscess drains in radiology, possible prolonged hospital stay, possible diarrhea from removal of part of the colon, possible constipation from narcotics, possible bowel, bladder or sexual dysfunction if having rectal surgery, prolonged fatigue/weakness or appetite loss, possible early recurrence of of disease, possible complications of their medical problems such as heart disease or arrhythmias or lung problems, death (less than 1%). I believe the patient understands and wishes to proceed with the surgery.

## 2018-03-14 NOTE — Patient Instructions (Addendum)
Sandra Mckee  03/14/2018   Your procedure is scheduled on: 03-27-18   Report to [facility] Main  Entrance    Report to admitting at 6:30AM    Call this number if you have problems the morning of surgery [phone]     Remember: [place]. DRINK PLENTY OF CLEAR LIQUIDS ON THE DAY OF YOUR BOWEL PRER. DRINK 2 PRESURGERY ENSURE DRINKS THE NIGHT BEFORE SURGERY AT  10:00 PM. NOTHING BY MOUTH EXCEPT CLEAR LIQUIDS UNTIL THREE HOURS PRIOR TO SCHEDULED SURGERY. DRINK 1 PRESURGERY THE DAY OF THE PROCEDURE 3 HOURS PRIOR TO SCHEDULED SURGERY WHICH NEEDS TO BE COMPLETED AT ____5:30AM_____.      Take these medicines the morning of surgery with A SIP OF WATER: none                                You may not have any metal on your body including hair pins and              piercings  Do not wear jewelry, make-up, lotions, powders or perfumes, deodorant             Do not wear nail polish.  Do not shave  48 hours prior to surgery.               Do not bring valuables to the hospital. [place].  Contacts, dentures or bridgework may not be worn into surgery.  Leave suitcase in the car. After surgery it may be brought to your room.                Please read over the following fact sheets you were given: _____________________________________________________________________             [facility] - Preparing for Surgery Before surgery, you can play an important role.  Because skin is not sterile, your skin needs to be as free of germs as possible.  You can reduce the number of germs on your skin by washing with CHG (chlorahexidine gluconate) soap before surgery.  CHG is an antiseptic cleaner which kills germs and bonds with the skin to continue killing germs even after washing. Please DO NOT use if you have an allergy to CHG or antibacterial soaps.  If your skin  becomes reddened/irritated stop using the CHG and inform your nurse when you arrive at Short Stay. Do not shave (including legs and underarms) for at least 48 hours prior to the first CHG shower.  You may shave your face/neck. Please follow these instructions carefully:  1.  Shower with CHG Soap the night before surgery and the  morning of Surgery.  2.  If you choose to wash your hair, wash your hair first as usual with your  normal  shampoo.  3.  After you shampoo, rinse your hair and body thoroughly to remove the  shampoo.                           4.  Use CHG as you would any other liquid soap.  You can apply chg directly  to the skin and wash  Gently with a scrungie or clean washcloth.  5.  Apply the CHG Soap to your body ONLY FROM THE NECK DOWN.   Do not use on face/ open                           Wound or open sores. Avoid contact with eyes, ears mouth and genitals (private parts).                       Wash face,  Genitals (private parts) with your normal soap.             6.  Wash thoroughly, paying special attention to the area where your surgery  will be performed.  7.  Thoroughly rinse your body with warm water from the neck down.  8.  DO NOT shower/wash with your normal soap after using and rinsing off  the CHG Soap.                9.  Pat yourself dry with a clean towel.            10.  Wear clean pajamas.            11.  Place clean sheets on your bed the night of your first shower and do not  sleep with pets. Day of Surgery : Do not apply any lotions/deodorants the morning of surgery.  Please wear clean clothes to the hospital/surgery center.  FAILURE TO FOLLOW THESE INSTRUCTIONS MAY RESULT IN THE CANCELLATION OF YOUR SURGERY PATIENT SIGNATURE_________________________________  NURSE SIGNATURE__________________________________  ________________________________________________________________________   [name]  An incentive spirometer is a  tool that can help keep your lungs clear and active. This tool measures how well you are filling your lungs with each breath. Taking long deep breaths may help reverse or decrease the chance of developing breathing (pulmonary) problems (especially infection) following:  A long period of time when you are unable to move or be active. BEFORE THE PROCEDURE   If the spirometer includes an indicator to show your best effort, your nurse or respiratory therapist will set it to a desired goal.  If possible, sit up straight or lean slightly forward. Try not to slouch.  Hold the incentive spirometer in an upright position. INSTRUCTIONS FOR USE  1. Sit on the edge of your bed if possible, or sit up as far as you can in bed or on a chair. 2. Hold the incentive spirometer in an upright position. 3. Breathe out normally. 4. Place the mouthpiece in your mouth and seal your lips tightly around it. 5. Breathe in slowly and as deeply as possible, raising the piston or the ball toward the top of the column. 6. Hold your breath for 3-5 seconds or for as long as possible. Allow the piston or ball to fall to the bottom of the column. 7. Remove the mouthpiece from your mouth and breathe out normally. 8. Rest for a few seconds and repeat Steps 1 through 7 at least 10 times every 1-2 hours when you are awake. Take your time and take a few normal breaths between deep breaths. 9. The spirometer may include an indicator to show your best effort. Use the indicator as a goal to work toward during each repetition. 10. After each set of 10 deep breaths, practice coughing to be sure your lungs are clear. If you have an incision (the cut made at the time of surgery),  support your incision when coughing by placing a pillow or rolled up towels firmly against it. Once you are able to get out of bed, walk around indoors and cough well. You may stop using the incentive spirometer when instructed by your caregiver.  RISKS AND  COMPLICATIONS  Take your time so you do not get dizzy or light-headed.  If you are in pain, you may need to take or ask for pain medication before doing incentive spirometry. It is harder to take a deep breath if you are having pain. AFTER USE  Rest and breathe slowly and easily.  It can be helpful to keep track of a log of your progress. Your caregiver can provide you with a simple table to help with this. If you are using the spirometer at home, follow these instructions: [place] IF:   You are having difficultly using the spirometer.  You have trouble using the spirometer as often as instructed.  Your pain medication is not giving enough relief while using the spirometer.  You develop fever of 100.5 F (38.1 C) or higher. SEEK IMMEDIATE MEDICAL CARE IF:   You cough up bloody sputum that had not been present before.  You develop fever of 102 F (38.9 C) or greater.  You develop worsening pain at or near the incision site. MAKE SURE YOU:   Understand these instructions.  Will watch your condition.  Will get help right away if you are not doing well or get worse. Document Released: 01/15/2007 Document Revised: 11/27/2011 Document Reviewed: 03/18/2007 [facility] Patient Information 2014 [place], [place].   ________________________________________________________________________

## 2018-03-14 NOTE — Progress Notes (Signed)
CT CHEST 02-28-18 Epic   CMP, CBC 02-26-18 Epic

## 2018-03-22 ENCOUNTER — Other Ambulatory Visit: Payer: Self-pay

## 2018-03-22 ENCOUNTER — Encounter (HOSPITAL_COMMUNITY)
Admission: RE | Admit: 2018-03-22 | Discharge: 2018-03-22 | Disposition: A | Discharge status: Home / Self Care | Payer: BLUE CROSS/BLUE SHIELD | Source: Ambulatory Visit | Attending: General Surgery | Admitting: General Surgery

## 2018-03-22 ENCOUNTER — Encounter (HOSPITAL_COMMUNITY): Payer: Self-pay

## 2018-03-22 ENCOUNTER — Other Ambulatory Visit (HOSPITAL_COMMUNITY): Payer: BLUE CROSS/BLUE SHIELD

## 2018-03-22 DIAGNOSIS — Z01818 Encounter for other preprocedural examination: Secondary | ICD-10-CM | POA: Diagnosis not present

## 2018-03-22 DIAGNOSIS — C189 Malignant neoplasm of colon, unspecified: Secondary | ICD-10-CM | POA: Diagnosis not present

## 2018-03-22 HISTORY — DX: Prediabetes: R73.03

## 2018-03-22 HISTORY — DX: Malignant neoplasm of colon, unspecified: C18.9

## 2018-03-22 HISTORY — DX: Unspecified hearing loss, right ear: H91.91

## 2018-03-22 NOTE — Progress Notes (Signed)
RN CALLED KAREN DOLBY AGAIN AND [facility] TO MAKE AWARE THAT PATIENT HAD BEEN GIVEN THE 3 ENSURE PRE-SURGERY DRINKS AT THE PAT APPT TODAY AND ALSO PROVIDED PATIENT CONTACT INFO. RN WILL CONTINUE TO F/U

## 2018-03-22 NOTE — Progress Notes (Signed)
At PAT appt today , patient reports that she has received no info about ERAS nutrition class for upcoming partial colectomy. Order in epic under Dr Marcello Moores for ERAS pathway with 3 ENSURE drinks. RN called Pecolia Ades [phone]) and [facility] to inform. RN advised patient that Santiago Glad would be in contact with her to provide info on ERAS class schedule. Patient states that she is working 12 hour shifts this week and doing her bowel prep and is unhappy that she will have to come back to [employer] for ERAS class. RN provided patient with the 3 ordered drinks and instructed patient to let Santiago Glad know she has already received her drinks from the hospital pre-op appt. Patient verbalized understanding. Patient expressed satisfaction with appt today and thanked RN for the help.

## 2018-03-26 MED ORDER — BUPIVACAINE LIPOSOME 1.3 % IJ SUSP
20.0000 mL | Freq: Once | INTRAMUSCULAR | Status: DC
  Filled 2018-03-26: qty 20

## 2018-03-26 NOTE — Progress Notes (Signed)
email communication with Pecolia Ades, RN  on 03-26-18. Jeanella Flattery confirms that she has spoken with patient about ERAS education.

## 2018-03-27 ENCOUNTER — Inpatient Hospital Stay (HOSPITAL_COMMUNITY): Payer: BLUE CROSS/BLUE SHIELD | Admitting: Anesthesiology

## 2018-03-27 ENCOUNTER — Encounter (HOSPITAL_COMMUNITY): Payer: Self-pay

## 2018-03-27 ENCOUNTER — Encounter (HOSPITAL_COMMUNITY)
Admission: RE | Disposition: A | Discharge status: Home / Self Care | Payer: Self-pay | Source: Ambulatory Visit | Attending: General Surgery

## 2018-03-27 ENCOUNTER — Other Ambulatory Visit: Payer: Self-pay

## 2018-03-27 ENCOUNTER — Inpatient Hospital Stay (HOSPITAL_COMMUNITY)
Admission: RE | Admit: 2018-03-27 | Discharge: 2018-03-29 | DRG: 331 | Disposition: A | Discharge status: Home / Self Care | Payer: BLUE CROSS/BLUE SHIELD | Source: Ambulatory Visit | Attending: General Surgery | Admitting: General Surgery

## 2018-03-27 DIAGNOSIS — K649 Unspecified hemorrhoids: Secondary | ICD-10-CM | POA: Diagnosis present

## 2018-03-27 DIAGNOSIS — C187 Malignant neoplasm of sigmoid colon: Principal | ICD-10-CM | POA: Diagnosis present

## 2018-03-27 DIAGNOSIS — I10 Essential (primary) hypertension: Secondary | ICD-10-CM | POA: Diagnosis present

## 2018-03-27 DIAGNOSIS — C189 Malignant neoplasm of colon, unspecified: Secondary | ICD-10-CM | POA: Diagnosis present

## 2018-03-27 LAB — TYPE AND SCREEN
ABO/RH(D): O POS
Antibody Screen: NEGATIVE

## 2018-03-27 LAB — GLUCOSE, CAPILLARY: GLUCOSE-CAPILLARY: 206 mg/dL — AB (ref 70–99)

## 2018-03-27 LAB — ABO/RH: ABO/RH(D): O POS

## 2018-03-27 SURGERY — COLECTOMY, PARTIAL, ROBOT-ASSISTED, LAPAROSCOPIC
Anesthesia: General

## 2018-03-27 MED ORDER — ROCURONIUM BROMIDE 100 MG/10ML IV SOLN
INTRAVENOUS | Status: AC
  Filled 2018-03-27: qty 1

## 2018-03-27 MED ORDER — KETAMINE HCL 10 MG/ML IJ SOLN
INTRAMUSCULAR | Status: AC
  Filled 2018-03-27: qty 1

## 2018-03-27 MED ORDER — LIDOCAINE 2% (20 MG/ML) 5 ML SYRINGE
INTRAMUSCULAR | Status: DC | PRN
  Administered 2018-03-27: 60 mg via INTRAVENOUS

## 2018-03-27 MED ORDER — DEXAMETHASONE SODIUM PHOSPHATE 10 MG/ML IJ SOLN
INTRAMUSCULAR | Status: DC | PRN
  Administered 2018-03-27: 8 mg via INTRAVENOUS

## 2018-03-27 MED ORDER — LIDOCAINE HCL 2 % IJ SOLN
INTRAMUSCULAR | Status: AC
  Filled 2018-03-27: qty 20

## 2018-03-27 MED ORDER — DEXAMETHASONE SODIUM PHOSPHATE 10 MG/ML IJ SOLN
INTRAMUSCULAR | Status: AC
  Filled 2018-03-27: qty 2

## 2018-03-27 MED ORDER — OXYCODONE HCL 5 MG/5ML PO SOLN
5.0000 mg | Freq: Once | ORAL | Status: DC | PRN
  Filled 2018-03-27: qty 5

## 2018-03-27 MED ORDER — ALVIMOPAN 12 MG PO CAPS
12.0000 mg | ORAL_CAPSULE | ORAL | Status: AC
  Administered 2018-03-27: 12 mg via ORAL
  Filled 2018-03-27: qty 1

## 2018-03-27 MED ORDER — FENTANYL CITRATE (PF) 100 MCG/2ML IJ SOLN
25.0000 ug | INTRAMUSCULAR | Status: DC | PRN
  Administered 2018-03-27: 50 ug via INTRAVENOUS

## 2018-03-27 MED ORDER — SODIUM CHLORIDE 0.9 % IV SOLN
2.0000 g | INTRAVENOUS | Status: AC
  Administered 2018-03-27: 2 g via INTRAVENOUS
  Filled 2018-03-27: qty 2

## 2018-03-27 MED ORDER — HYDROCHLOROTHIAZIDE 12.5 MG PO CAPS
12.5000 mg | ORAL_CAPSULE | Freq: Every day | ORAL | Status: DC
Start: 2018-03-28 — End: 2018-03-29
  Administered 2018-03-28: 12.5 mg via ORAL
  Filled 2018-03-27: qty 1

## 2018-03-27 MED ORDER — SODIUM CHLORIDE 0.9 % IV SOLN
2.0000 g | Freq: Two times a day (BID) | INTRAVENOUS | Status: AC
  Administered 2018-03-27: 2 g via INTRAVENOUS
  Filled 2018-03-27: qty 2

## 2018-03-27 MED ORDER — ONDANSETRON HCL 4 MG/2ML IJ SOLN: 4.0000 mg | Freq: Four times a day (QID) | INTRAMUSCULAR | Status: DC | PRN

## 2018-03-27 MED ORDER — LIDOCAINE 2% (20 MG/ML) 5 ML SYRINGE
INTRAMUSCULAR | Status: AC
  Filled 2018-03-27: qty 5

## 2018-03-27 MED ORDER — PROPOFOL 10 MG/ML IV BOLUS
INTRAVENOUS | Status: AC
  Filled 2018-03-27: qty 20

## 2018-03-27 MED ORDER — PROPOFOL 10 MG/ML IV BOLUS
INTRAVENOUS | Status: DC | PRN
  Administered 2018-03-27: 130 mg via INTRAVENOUS

## 2018-03-27 MED ORDER — ENSURE SURGERY PO LIQD
237.0000 mL | Freq: Two times a day (BID) | ORAL | Status: DC
  Administered 2018-03-27: 237 mL via ORAL
  Filled 2018-03-27 (×5): qty 237

## 2018-03-27 MED ORDER — EPHEDRINE 5 MG/ML INJ
INTRAVENOUS | Status: AC
  Filled 2018-03-27: qty 10

## 2018-03-27 MED ORDER — OXYCODONE HCL 5 MG PO TABS: 5.0000 mg | ORAL_TABLET | Freq: Once | ORAL | Status: DC | PRN

## 2018-03-27 MED ORDER — PHENYLEPHRINE 40 MCG/ML (10ML) SYRINGE FOR IV PUSH (FOR BLOOD PRESSURE SUPPORT)
PREFILLED_SYRINGE | INTRAVENOUS | Status: AC
  Filled 2018-03-27: qty 10

## 2018-03-27 MED ORDER — HYDROMORPHONE HCL 1 MG/ML IJ SOLN: 0.5000 mg | INTRAMUSCULAR | Status: DC | PRN

## 2018-03-27 MED ORDER — BUPIVACAINE LIPOSOME 1.3 % IJ SUSP
INTRAMUSCULAR | Status: DC | PRN
  Administered 2018-03-27: 20 mL

## 2018-03-27 MED ORDER — SUGAMMADEX SODIUM 200 MG/2ML IV SOLN
INTRAVENOUS | Status: DC | PRN
  Administered 2018-03-27: 100 mg via INTRAVENOUS
  Administered 2018-03-27: 50 mg via INTRAVENOUS

## 2018-03-27 MED ORDER — ONDANSETRON HCL 4 MG PO TABS: 4.0000 mg | ORAL_TABLET | Freq: Four times a day (QID) | ORAL | Status: DC | PRN

## 2018-03-27 MED ORDER — FENTANYL CITRATE (PF) 250 MCG/5ML IJ SOLN
INTRAMUSCULAR | Status: DC | PRN
  Administered 2018-03-27: 50 ug via INTRAVENOUS
  Administered 2018-03-27: 100 ug via INTRAVENOUS

## 2018-03-27 MED ORDER — ENOXAPARIN SODIUM 40 MG/0.4ML ~~LOC~~ SOLN
40.0000 mg | SUBCUTANEOUS | Status: DC
  Administered 2018-03-28: 40 mg via SUBCUTANEOUS
  Filled 2018-03-27: qty 0.4

## 2018-03-27 MED ORDER — GLYCOPYRROLATE PF 0.2 MG/ML IJ SOSY
PREFILLED_SYRINGE | INTRAMUSCULAR | Status: DC | PRN
  Administered 2018-03-27: .2 mg via INTRAVENOUS

## 2018-03-27 MED ORDER — DIPHENHYDRAMINE HCL 12.5 MG/5ML PO ELIX: 12.5000 mg | ORAL_SOLUTION | Freq: Four times a day (QID) | ORAL | Status: DC | PRN

## 2018-03-27 MED ORDER — LISINOPRIL-HYDROCHLOROTHIAZIDE 20-12.5 MG PO TABS: 1.0000 | ORAL_TABLET | Freq: Every day | ORAL | Status: DC

## 2018-03-27 MED ORDER — MIDAZOLAM HCL 2 MG/2ML IJ SOLN
INTRAMUSCULAR | Status: AC
  Filled 2018-03-27: qty 2

## 2018-03-27 MED ORDER — LACTATED RINGERS IV SOLN
INTRAVENOUS | Status: DC
  Administered 2018-03-27 (×2): via INTRAVENOUS

## 2018-03-27 MED ORDER — ACETAMINOPHEN 500 MG PO TABS
1000.0000 mg | ORAL_TABLET | Freq: Four times a day (QID) | ORAL | Status: DC
  Administered 2018-03-27 – 2018-03-29 (×7): 1000 mg via ORAL
  Filled 2018-03-27 (×7): qty 2

## 2018-03-27 MED ORDER — FENTANYL CITRATE (PF) 250 MCG/5ML IJ SOLN
INTRAMUSCULAR | Status: AC
  Filled 2018-03-27: qty 5

## 2018-03-27 MED ORDER — ONDANSETRON HCL 4 MG/2ML IJ SOLN
INTRAMUSCULAR | Status: AC
  Filled 2018-03-27: qty 4

## 2018-03-27 MED ORDER — ROCURONIUM BROMIDE 10 MG/ML (PF) SYRINGE
PREFILLED_SYRINGE | INTRAVENOUS | Status: DC | PRN
  Administered 2018-03-27: 50 mg via INTRAVENOUS
  Administered 2018-03-27: 10 mg via INTRAVENOUS

## 2018-03-27 MED ORDER — DIPHENHYDRAMINE HCL 50 MG/ML IJ SOLN: 12.5000 mg | Freq: Four times a day (QID) | INTRAMUSCULAR | Status: DC | PRN

## 2018-03-27 MED ORDER — GLYCOPYRROLATE 0.2 MG/ML IV SOSY
PREFILLED_SYRINGE | INTRAVENOUS | Status: AC
  Filled 2018-03-27: qty 3

## 2018-03-27 MED ORDER — KCL IN DEXTROSE-NACL 20-5-0.45 MEQ/L-%-% IV SOLN
INTRAVENOUS | Status: DC
  Administered 2018-03-27: 13:00:00 via INTRAVENOUS

## 2018-03-27 MED ORDER — LACTATED RINGERS IR SOLN
Status: DC | PRN
  Administered 2018-03-27: 1000 mL

## 2018-03-27 MED ORDER — ONDANSETRON HCL 4 MG/2ML IJ SOLN
INTRAMUSCULAR | Status: DC | PRN
  Administered 2018-03-27: 4 mg via INTRAVENOUS

## 2018-03-27 MED ORDER — BUPIVACAINE-EPINEPHRINE (PF) 0.25% -1:200000 IJ SOLN
INTRAMUSCULAR | Status: AC
  Filled 2018-03-27: qty 30

## 2018-03-27 MED ORDER — PHENYLEPHRINE HCL 10 MG/ML IJ SOLN
INTRAMUSCULAR | Status: DC | PRN
  Administered 2018-03-27: 50 ug/min via INTRAVENOUS

## 2018-03-27 MED ORDER — LISINOPRIL 20 MG PO TABS
20.0000 mg | ORAL_TABLET | Freq: Every day | ORAL | Status: DC
  Administered 2018-03-28: 20 mg via ORAL
  Filled 2018-03-27 (×2): qty 2

## 2018-03-27 MED ORDER — SACCHAROMYCES BOULARDII 250 MG PO CAPS
250.0000 mg | ORAL_CAPSULE | Freq: Two times a day (BID) | ORAL | Status: DC
  Administered 2018-03-27 – 2018-03-28 (×2): 250 mg via ORAL
  Filled 2018-03-27 (×3): qty 1

## 2018-03-27 MED ORDER — BUPIVACAINE-EPINEPHRINE (PF) 0.25% -1:200000 IJ SOLN
INTRAMUSCULAR | Status: DC | PRN
  Administered 2018-03-27: 30 mL

## 2018-03-27 MED ORDER — FENTANYL CITRATE (PF) 100 MCG/2ML IJ SOLN
INTRAMUSCULAR | Status: AC
  Filled 2018-03-27: qty 2

## 2018-03-27 MED ORDER — GABAPENTIN 300 MG PO CAPS
300.0000 mg | ORAL_CAPSULE | ORAL | Status: AC
  Administered 2018-03-27: 300 mg via ORAL
  Filled 2018-03-27: qty 1

## 2018-03-27 MED ORDER — ACETAMINOPHEN 500 MG PO TABS
1000.0000 mg | ORAL_TABLET | ORAL | Status: AC
  Administered 2018-03-27: 1000 mg via ORAL
  Filled 2018-03-27: qty 2

## 2018-03-27 MED ORDER — KETAMINE HCL 10 MG/ML IJ SOLN
INTRAMUSCULAR | Status: DC | PRN
  Administered 2018-03-27: 30 mg via INTRAVENOUS

## 2018-03-27 MED ORDER — PHENYLEPHRINE 40 MCG/ML (10ML) SYRINGE FOR IV PUSH (FOR BLOOD PRESSURE SUPPORT)
PREFILLED_SYRINGE | INTRAVENOUS | Status: DC | PRN
  Administered 2018-03-27: 40 ug via INTRAVENOUS

## 2018-03-27 MED ORDER — ALVIMOPAN 12 MG PO CAPS: 12.0000 mg | ORAL_CAPSULE | Freq: Two times a day (BID) | ORAL | Status: DC

## 2018-03-27 MED ORDER — MIDAZOLAM HCL 5 MG/5ML IJ SOLN
INTRAMUSCULAR | Status: DC | PRN
  Administered 2018-03-27: 2 mg via INTRAVENOUS

## 2018-03-27 MED ORDER — 0.9 % SODIUM CHLORIDE (POUR BTL) OPTIME
TOPICAL | Status: DC | PRN
  Administered 2018-03-27: 2000 mL

## 2018-03-27 MED ORDER — LIDOCAINE 2% (20 MG/ML) 5 ML SYRINGE
INTRAMUSCULAR | Status: DC | PRN
  Administered 2018-03-27: 1.5 mg/kg/h via INTRAVENOUS

## 2018-03-27 SURGICAL SUPPLY — 97 items
ADH SKN CLS APL DERMABOND .7 (GAUZE/BANDAGES/DRESSINGS) ×1
BLADE EXTENDED COATED 6.5IN (ELECTRODE) IMPLANT
CANNULA REDUC XI 12-8 STAPL (CANNULA) ×1
CANNULA REDUC XI 12-8MM STAPL (CANNULA) ×1
CANNULA REDUCER 12-8 DVNC XI (CANNULA) ×1 IMPLANT
CELLS DAT CNTRL 66122 CELL SVR (MISCELLANEOUS) IMPLANT
CLIP VESOLOCK LG 6/CT PURPLE (CLIP) IMPLANT
CLIP VESOLOCK MED 6/CT (CLIP) IMPLANT
COVER SURGICAL LIGHT HANDLE (MISCELLANEOUS) ×6 IMPLANT
COVER TIP SHEARS 8 DVNC (MISCELLANEOUS) ×1 IMPLANT
COVER TIP SHEARS 8MM DA VINCI (MISCELLANEOUS) ×2
DECANTER SPIKE VIAL GLASS SM (MISCELLANEOUS) ×2 IMPLANT
DERMABOND ADVANCED (GAUZE/BANDAGES/DRESSINGS) ×2
DERMABOND ADVANCED .7 DNX12 (GAUZE/BANDAGES/DRESSINGS) IMPLANT
DRAIN CHANNEL 19F RND (DRAIN) IMPLANT
DRAPE ARM DVNC X/XI (DISPOSABLE) ×4 IMPLANT
DRAPE COLUMN DVNC XI (DISPOSABLE) ×1 IMPLANT
DRAPE DA VINCI XI ARM (DISPOSABLE) ×8
DRAPE DA VINCI XI COLUMN (DISPOSABLE) ×2
DRAPE SURG IRRIG POUCH 19X23 (DRAPES) ×3 IMPLANT
DRSG OPSITE POSTOP 4X6 (GAUZE/BANDAGES/DRESSINGS) ×2 IMPLANT
ELECT PENCIL ROCKER SW 15FT (MISCELLANEOUS) ×3 IMPLANT
ELECT REM PT RETURN 15FT ADLT (MISCELLANEOUS) ×3 IMPLANT
ENDOLOOP SUT PDS II  0 18 (SUTURE)
ENDOLOOP SUT PDS II 0 18 (SUTURE) IMPLANT
EVACUATOR SILICONE 100CC (DRAIN) IMPLANT
GLOVE BIO SURGEON STRL SZ 6.5 (GLOVE) ×6 IMPLANT
GLOVE BIO SURGEONS STRL SZ 6.5 (GLOVE) ×3
GLOVE BIOGEL PI IND STRL 7.0 (GLOVE) ×3 IMPLANT
GLOVE BIOGEL PI INDICATOR 7.0 (GLOVE) ×6
GOWN STRL REUS W/TWL 2XL LVL3 (GOWN DISPOSABLE) ×11 IMPLANT
GOWN STRL REUS W/TWL XL LVL3 (GOWN DISPOSABLE) ×12 IMPLANT
GRASPER ENDOPATH ANVIL 10MM (MISCELLANEOUS) IMPLANT
GRASPER SUT TROCAR 14GX15 (MISCELLANEOUS) IMPLANT
HOLDER FOLEY CATH W/STRAP (MISCELLANEOUS) ×3 IMPLANT
IRRIG SUCT STRYKERFLOW 2 WTIP (MISCELLANEOUS) ×3
IRRIGATION SUCT STRKRFLW 2 WTP (MISCELLANEOUS) ×1 IMPLANT
IRRIGATOR SUCT 8 DISP DVNC XI (IRRIGATION / IRRIGATOR) IMPLANT
IRRIGATOR SUCTION 8MM XI DISP (IRRIGATION / IRRIGATOR)
KIT PROCEDURE DA VINCI SI (MISCELLANEOUS) ×2
KIT PROCEDURE DVNC SI (MISCELLANEOUS) ×1 IMPLANT
KIT SIGMOIDOSCOPE (SET/KITS/TRAYS/PACK) ×2 IMPLANT
NDL INSUFFLATION 14GA 120MM (NEEDLE) ×1 IMPLANT
NEEDLE INSUFFLATION 14GA 120MM (NEEDLE) ×3 IMPLANT
PACK CARDIOVASCULAR III (CUSTOM PROCEDURE TRAY) ×3 IMPLANT
PACK COLON (CUSTOM PROCEDURE TRAY) ×3 IMPLANT
PORT LAP GEL ALEXIS MED 5-9CM (MISCELLANEOUS) ×2 IMPLANT
RELOAD STAPLE 45 BLU REG DVNC (STAPLE) IMPLANT
RELOAD STAPLE 45 GRN THCK DVNC (STAPLE) IMPLANT
RETRACTOR WND ALEXIS 18 MED (MISCELLANEOUS) IMPLANT
RTRCTR WOUND ALEXIS 18CM MED (MISCELLANEOUS)
SCISSORS LAP 5X35 DISP (ENDOMECHANICALS) ×3 IMPLANT
SEAL CANN UNIV 5-8 DVNC XI (MISCELLANEOUS) ×3 IMPLANT
SEAL XI 5MM-8MM UNIVERSAL (MISCELLANEOUS) ×6
SEALER VESSEL DA VINCI XI (MISCELLANEOUS) ×2
SEALER VESSEL EXT DVNC XI (MISCELLANEOUS) ×1 IMPLANT
SLEEVE ADV FIXATION 5X100MM (TROCAR) IMPLANT
SOLUTION ELECTROLUBE (MISCELLANEOUS) ×3 IMPLANT
STAPLER 45 BLU RELOAD XI (STAPLE) ×2 IMPLANT
STAPLER 45 BLUE RELOAD XI (STAPLE) ×4
STAPLER 45 GREEN RELOAD XI (STAPLE)
STAPLER 45 GRN RELOAD XI (STAPLE) IMPLANT
STAPLER CANNULA SEAL DVNC XI (STAPLE) ×1 IMPLANT
STAPLER CANNULA SEAL XI (STAPLE) ×2
STAPLER CIRC CVD 29MM 37CM (STAPLE) ×2 IMPLANT
STAPLER SHEATH (SHEATH) ×2
STAPLER SHEATH ENDOWRIST DVNC (SHEATH) ×1 IMPLANT
STAPLER VISISTAT 35W (STAPLE) IMPLANT
SUT ETHILON 2 0 PS N (SUTURE) IMPLANT
SUT NOVA NAB DX-16 0-1 5-0 T12 (SUTURE) ×6 IMPLANT
SUT PROLENE 2 0 KS (SUTURE) ×3 IMPLANT
SUT SILK 2 0 (SUTURE) ×3
SUT SILK 2 0 SH CR/8 (SUTURE) ×3 IMPLANT
SUT SILK 2-0 18XBRD TIE 12 (SUTURE) ×1 IMPLANT
SUT SILK 3 0 (SUTURE) ×3
SUT SILK 3 0 SH CR/8 (SUTURE) ×3 IMPLANT
SUT SILK 3-0 18XBRD TIE 12 (SUTURE) ×1 IMPLANT
SUT V-LOC BARB 180 2/0GR6 GS22 (SUTURE)
SUT VIC AB 2-0 SH 18 (SUTURE) ×1 IMPLANT
SUT VIC AB 2-0 SH 27 (SUTURE) ×6
SUT VIC AB 2-0 SH 27X BRD (SUTURE) ×1 IMPLANT
SUT VIC AB 3-0 SH 18 (SUTURE) ×1 IMPLANT
SUT VIC AB 4-0 PS2 18 (SUTURE) ×2 IMPLANT
SUT VIC AB 4-0 PS2 27 (SUTURE) ×6 IMPLANT
SUT VICRYL 0 UR6 27IN ABS (SUTURE) ×1 IMPLANT
SUTURE V-LC BRB 180 2/0GR6GS22 (SUTURE) IMPLANT
SYR 10ML ECCENTRIC (SYRINGE) ×3 IMPLANT
SYS LAPSCP GELPORT 120MM (MISCELLANEOUS)
SYSTEM LAPSCP GELPORT 120MM (MISCELLANEOUS) IMPLANT
TAPE STRIPS DRAPE STRL (GAUZE/BANDAGES/DRESSINGS) ×2 IMPLANT
TOWEL OR 17X26 10 PK STRL BLUE (TOWEL DISPOSABLE) IMPLANT
TOWEL OR NON WOVEN STRL DISP B (DISPOSABLE) ×3 IMPLANT
TRAY FOLEY W/BAG SLVR 14FR (SET/KITS/TRAYS/PACK) ×2 IMPLANT
TROCAR ADV FIXATION 5X100MM (TROCAR) ×3 IMPLANT
TUBING CONNECTING 10 (TUBING) ×4 IMPLANT
TUBING CONNECTING 10' (TUBING) ×2
TUBING INSUFFLATION 10FT LAP (TUBING) ×3 IMPLANT

## 2018-03-27 NOTE — Anesthesia Procedure Notes (Signed)
Procedure Name: Intubation Date/Time: 03/27/2018 8:32 AM Performed by: Mitzie Na, CRNA Pre-anesthesia Checklist: Patient identified, Emergency Drugs available, Suction available, Patient being monitored and Timeout performed Patient Re-evaluated:Patient Re-evaluated prior to induction Oxygen Delivery Method: Circle system utilized Preoxygenation: Pre-oxygenation with 100% oxygen Induction Type: IV induction Ventilation: Mask ventilation without difficulty Laryngoscope Size: Mac and 3 Grade View: Grade I Tube type: Oral Number of attempts: 1 Airway Equipment and Method: Stylet Placement Confirmation: ETT inserted through vocal cords under direct vision,  positive ETCO2 and breath sounds checked- equal and bilateral Secured at: 22 cm Tube secured with: Tape Dental Injury: Teeth and Oropharynx as per pre-operative assessment

## 2018-03-27 NOTE — Op Note (Signed)
03/27/2018  10:40 AM  PATIENT:  Sandra Mckee  62 y.o. female  Patient Care Team: Charlynn Court, NP as PCP - General (Nurse Practitioner)  PRE-OPERATIVE DIAGNOSIS:  COLON CANCER  POST-OPERATIVE DIAGNOSIS:  COLON CANCER  PROCEDURE:  XI ROBOTIC ASSISTED LAPAROSCOPIC SIGMOIDECTOMY    Surgeon(s): Leighton Ruff, MD Ralene Ok, MD  ASSISTANT: Dr Rosendo Gros  ANESTHESIA:   local and general  EBL: 47ml  Total I/O In: 1000 [I.V.:1000] Out: 125 [Urine:100; Blood:25]  Delay start of Pharmacological VTE agent (>24hrs) due to surgical blood loss or risk of bleeding:  no  DRAINS: none   SPECIMEN:  Source of Specimen:  Sigmoid colon  DISPOSITION OF SPECIMEN:  PATHOLOGY  COUNTS:  YES  PLAN OF CARE: Admit to inpatient   PATIENT DISPOSITION:  PACU - hemodynamically stable.  INDICATION:    62 y.o. F with sigmoid colon mass.  Biopsy showed adenocarcinoma.  I recommended segmental resection:  The anatomy & physiology of the digestive tract was discussed.  The pathophysiology was discussed.  Natural history risks without surgery was discussed.   I worked to give an overview of the disease and the frequent need to have multispecialty involvement.  I feel the risks of no intervention will lead to serious problems that outweigh the operative risks; therefore, I recommended a partial colectomy to remove the pathology.  Laparoscopic & open techniques were discussed.   Risks such as bleeding, infection, abscess, leak, reoperation, possible ostomy, hernia, heart attack, death, and other risks were discussed.  I noted a good likelihood this will help address the problem.   Goals of post-operative recovery were discussed as well.    The patient expressed understanding & wished to proceed with surgery.  OR FINDINGS:   Patient had mass at distal sigmoid colon with tattoo distally.  No obvious metastatic disease on visceral parietal peritoneum or liver.  The anastomosis rests 15 cm from the  anal verge by rigid proctoscopy.  DESCRIPTION:   Informed consent was confirmed.  The patient underwent general anaesthesia without difficulty.  The patient was positioned appropriately.  VTE prevention in place.  The patient's abdomen was clipped, prepped, & draped in a sterile fashion.  Surgical timeout confirmed our plan.  The patient was positioned in reverse Trendelenburg.  Abdominal entry was gained using a Varies needle in the LUQ.  Entry was clean.  I induced carbon dioxide insufflation.  An 72mm robotic port was placed in the RUQ.  Camera inspection revealed no injury.  Extra ports were carefully placed under direct laparoscopic visualization.  I laparoscopically reflected the greater omentum and the upper abdomen the small bowel in the upper abdomen. The patient was appropriately positioned and the robot was docked to the patient's left side.  Instruments were placed under direct visualization.    I mobilized the sigmoid colon off of the pelvic sidewall.  I scored the base of peritoneum of the right side of the mesentery of the left colon from the ligament of Treitz to the peritoneal reflection of the mid rectum.  The patient had an obvious distal sigmoid mass and tattoo distally.  I elevated the sigmoid mesentery and enetered into the retro-mesenteric plane. We were able to identify the left ureter and gonadal vessels. We kept those posterior within the retroperitoneum and elevated the left colon mesentery off that. I did isolated IMA pedicle but did not ligate it yet.  I continued distally and got into the avascular plane posterior to the mesorectum. This allowed me to help  mobilize the rectum as well by freeing the mesorectum off the sacrum.  I mobilized the peritoneal coverings towards the peritoneal reflection on both the right and left sides of the rectum.  I could see the right and left ureters and stayed away from them.    I skeletonized the inferior mesenteric artery pedicle.  I went down  to its takeoff from the aorta.   I isolated the inferior mesenteric vein off of the ligament of Treitz just cephalad to that as well.  After confirming the left ureter was out of the way, I went ahead and ligated the inferior mesenteric artery pedicle with bipolar robotic vessel sealer ~2cm above its takeoff from the aorta. We ensured hemostasis. I skeletonized the mesorectum at the junction at the proximal rectum using blunt dissection & bipolar robotic vessel sealer.  I mobilized the left colon in a lateral to medial fashion off the line of Toldt up towards the splenic flexure to ensure good mobilization of the left colon to reach into the pelvis.  I skeletonized the mesorectum at the rectosigmoid junction using the robotic vessel sealer.  I then divided the rectosigmoid with 2 blue load robotic staplers.  Hemostasis was good.  I confirmed mobilization into the pelvis of the descending colon.  The robot was then undocked and the 12 mm suprapubic port was enlarged into a Pfannenstiel incision.  An [place] wound protector was placed.  The colon was brought out through the wound and the descending sigmoid junction was transected using electrocautery over a pursestring device with 2-0 Prolene pursestring suture.  The remaining mesentery was divided using suture ligatures.  The specimen was then sent to pathology for further examination.  I placed a 29 mm Ethicon EEA anvil into the distal descending colon.  The pursestring was tied tightly around this.  The remaining fat was cleared away from the anvil border and this was placed back into the abdomen.  The cap was placed on the Alexis and the EEA stapler was brought out through the anterior border of the staple line in the rectal stump.  An anastomosis was created without difficulty.  There was no tension on the anastomosis.  There was no leak when tested with insufflation under water.  The abdomen was irrigated and the omentum was brought down into the pelvis.   Hemostasis was good.  The ports and wound protector were removed.  We then switched to clean gowns, gloves, instruments and drapes.  The peritoneum was closed using running 2-0 Vicryl suture.  The fascia was closed using #1 Novafil sutures.  The subcutaneous tissue was reapproximated with a running 2-0 Vicryl suture and the skin was closed with a 4-0 Vicryl running subcuticular suture.  The remaining port sites were closed using 4-0 Vicryl subcuticular sutures and Dermabond.  The patient was then awakened from anesthesia and sent to the postanesthesia care unit in stable condition.  All counts were correct per operating room staff.  An MD assistant was necessary for tissue manipulation, retraction and positioning due to the complexity of the case and hospital policies

## 2018-03-27 NOTE — Anesthesia Preprocedure Evaluation (Signed)
Anesthesia Evaluation  Patient identified by MRN, date of birth, ID band Patient awake    Reviewed: Allergy & Precautions, H&P , NPO status , Patient's Chart, lab work & pertinent test results  Airway Mallampati: II   Neck ROM: full    Dental   Pulmonary neg pulmonary ROS,    breath sounds clear to auscultation       Cardiovascular hypertension,  Rhythm:regular Rate:Normal     Neuro/Psych    GI/Hepatic Colon CA   Endo/Other    Renal/GU      Musculoskeletal  (+) Arthritis ,   Abdominal   Peds  Hematology   Anesthesia Other Findings   Reproductive/Obstetrics                             Anesthesia Physical Anesthesia Plan  ASA: II  Anesthesia Plan: General   Post-op Pain Management:    Induction: Intravenous  PONV Risk Score and Plan: 3 and Ondansetron, Dexamethasone, Midazolam, Scopolamine patch - Pre-op and Treatment may vary due to age or medical condition  Airway Management Planned: Oral ETT  Additional Equipment:   Intra-op Plan:   Post-operative Plan: Extubation in OR  Informed Consent: I have reviewed the patients History and Physical, chart, labs and discussed the procedure including the risks, benefits and alternatives for the proposed anesthesia with the patient or authorized representative who has indicated his/her understanding and acceptance.     Plan Discussed with: CRNA, Surgeon and Anesthesiologist  Anesthesia Plan Comments:         Anesthesia Quick Evaluation

## 2018-03-27 NOTE — Transfer of Care (Signed)
Immediate Anesthesia Transfer of Care Note  Patient: Sandra Mckee  Procedure(s) Performed: XI ROBOTIC ASSISTED LAPAROSCOPIC SIGMOID COLECTOMY (N/A )  Patient Location: PACU  Anesthesia Type:General  Level of Consciousness: awake, alert , oriented and patient cooperative  Airway & Oxygen Therapy: Patient Spontanous Breathing and Patient connected to face mask oxygen  Post-op Assessment: Report given to RN, Post -op Vital signs reviewed and stable and Patient moving all extremities  Post vital signs: Reviewed and stable  Last Vitals:  Vitals Value Taken Time  BP    Temp    Pulse 62 03/27/2018 10:58 AM  Resp 12 03/27/2018 10:58 AM  SpO2 100 % 03/27/2018 10:58 AM  Vitals shown include unvalidated device data.  Last Pain:  Vitals:   03/27/18 0724  TempSrc:   PainSc: 0-No pain         Complications: No apparent anesthesia complications

## 2018-03-27 NOTE — Interval H&P Note (Signed)
I have seen the patient and reviewed her h&p.  no new updates.  ready for surgery.  all questions answered.

## 2018-03-27 NOTE — Anesthesia Postprocedure Evaluation (Signed)
Anesthesia Post Note  Patient: Sandra Mckee  Procedure(s) Performed: XI ROBOTIC ASSISTED LAPAROSCOPIC SIGMOID COLECTOMY (N/A )     Patient location during evaluation: PACU Anesthesia Type: General Level of consciousness: awake and alert Pain management: pain level controlled Vital Signs Assessment: post-procedure vital signs reviewed and stable Respiratory status: spontaneous breathing, nonlabored ventilation, respiratory function stable and patient connected to nasal cannula oxygen Cardiovascular status: blood pressure returned to baseline and stable Postop Assessment: no apparent nausea or vomiting Anesthetic complications: no    Last Vitals:  Vitals:   03/27/18 1205 03/27/18 1304  BP: 121/80 134/82  Pulse: (!) 56 61  Resp: 15 18  Temp: 36.5 C 36.5 C  SpO2: 100% 100%    Last Pain:  Vitals:   03/27/18 1304  TempSrc: Oral  PainSc:                  [name]

## 2018-03-28 LAB — BASIC METABOLIC PANEL
Anion gap: 11 (ref 5–15)
BUN: 16 mg/dL (ref 8–23)
CO2: 23 mmol/L (ref 22–32)
Calcium: 9.1 mg/dL (ref 8.9–10.3)
Chloride: 102 mmol/L (ref 98–111)
Creatinine, Ser: 0.93 mg/dL (ref 0.44–1.00)
GFR calc Af Amer: >60 mL/min (ref >60)
GLUCOSE: 186 mg/dL — AB (ref 70–99)
POTASSIUM: 5.2 mmol/L — AB (ref 3.5–5.1)
Sodium: 136 mmol/L (ref 135–145)

## 2018-03-28 LAB — CBC
HCT: 42.1 % (ref 36.0–46.0)
Hemoglobin: 14 g/dL (ref 12.0–15.0)
MCH: 30.7 pg (ref 26.0–34.0)
MCHC: 33.3 g/dL (ref 30.0–36.0)
MCV: 92.3 fL (ref 78.0–100.0)
Platelets: 275 K/uL (ref 150–400)
RBC: 4.56 MIL/uL (ref 3.87–5.11)
RDW: 13.7 % (ref 11.5–15.5)
WBC: 15.1 K/uL — ABNORMAL HIGH (ref 4.0–10.5)

## 2018-03-28 MED ORDER — TRAMADOL HCL 50 MG PO TABS
50.0000 mg | ORAL_TABLET | Freq: Four times a day (QID) | ORAL | Status: DC | PRN
  Administered 2018-03-28: 50 mg via ORAL
  Filled 2018-03-28: qty 1

## 2018-03-28 NOTE — Progress Notes (Signed)
1 Day Post-Op Robotic Sigmoidectomy Subjective: Doing well.  Min pain.  Tolerating clears.  Objective: Vital signs in last 24 hours: Temp:  [97.6 F (36.4 C)-99 F (37.2 C)] 98 F (36.7 C) (07/11 0555) Pulse Rate:  [51-81] 73 (07/11 0555) Resp:  [11-18] 18 (07/11 0555) BP: (92-148)/(53-91) 136/91 (07/11 0555) SpO2:  [96 %-100 %] 100 % (07/11 0555) Weight:  [79 kg (174 lb 2.6 oz)] 79 kg (174 lb 2.6 oz) (07/11 0500)   Intake/Output from previous day: 07/10 0701 - 07/11 0700 In: 2304 [P.O.:579; I.V.:1625; IV Piggyback:100] Out: 2950 [Urine:2925; Blood:25] Intake/Output this shift: Total I/O In: 240 [P.O.:240] Out: 100 [Urine:100]   General appearance: alert GI: normal findings: soft, non-tender  Incision: no significant drainage  Lab Results:  Recent Labs    03/28/18 0438  WBC 15.1*  HGB 14.0  HCT 42.1  PLT 275   BMET Recent Labs    03/28/18 0438  NA 136  K 5.2*  CL 102  CO2 23  GLUCOSE 186*  BUN 16  CREATININE 0.93  CALCIUM 9.1   PT/INR No results for input(s): LABPROT, INR in the last 72 hours. ABG No results for input(s): PHART, HCO3 in the last 72 hours.  Invalid input(s): PCO2, PO2  MEDS, Scheduled . acetaminophen  1,000 mg Oral Q6H  . alvimopan  12 mg Oral BID  . enoxaparin (LOVENOX) injection  40 mg Subcutaneous Q24H  . feeding supplement  237 mL Oral BID BM  . lisinopril  20 mg Oral Daily   And  . hydrochlorothiazide  12.5 mg Oral Daily  . saccharomyces boulardii  250 mg Oral BID    Studies/Results: No results found.  Assessment: s/p Procedure(s): XI ROBOTIC ASSISTED LAPAROSCOPIC SIGMOID COLECTOMY Patient Active Problem List   Diagnosis Date Noted  . Colon cancer ([place]) 03/27/2018    Expected post op course  Plan: d/c foley Advance diet as tolerated SL MIV Ambulate   LOS: 1 day     .Rosario Adie, [facility], Kaeson Kleinert   03/28/2018 8:58 AM

## 2018-03-29 LAB — BASIC METABOLIC PANEL
Anion gap: 9 (ref 5–15)
BUN: 17 mg/dL (ref 8–23)
CALCIUM: 8.9 mg/dL (ref 8.9–10.3)
CHLORIDE: 102 mmol/L (ref 98–111)
CO2: 26 mmol/L (ref 22–32)
CREATININE: 0.76 mg/dL (ref 0.44–1.00)
GFR calc Af Amer: >60 mL/min (ref >60)
Glucose, Bld: 138 mg/dL — ABNORMAL HIGH (ref 70–99)
Potassium: 4.3 mmol/L (ref 3.5–5.1)
SODIUM: 137 mmol/L (ref 135–145)

## 2018-03-29 LAB — CBC
HCT: 41.3 % (ref 36.0–46.0)
Hemoglobin: 13.6 g/dL (ref 12.0–15.0)
MCH: 30.9 pg (ref 26.0–34.0)
MCHC: 32.9 g/dL (ref 30.0–36.0)
MCV: 93.9 fL (ref 78.0–100.0)
PLATELETS: 273 10*3/uL (ref 150–400)
RBC: 4.4 MIL/uL (ref 3.87–5.11)
RDW: 13.9 % (ref 11.5–15.5)
WBC: 10.4 10*3/uL (ref 4.0–10.5)

## 2018-03-29 NOTE — Discharge Summary (Signed)
Physician Discharge Summary  Patient ID: Sandra Mckee MRN: [id number] DOB/AGE: 64-22-1957 64 y.o.  Admit date: 03/27/2018 Discharge date: 03/29/2018  Admission Diagnoses: Colon cancer  Discharge Diagnoses:  Active Problems:   Colon cancer [facility])   Discharged Condition: good  Hospital Course: Pt admitted after surgery.  Diet advanced as tolerated.  Her pain was controlled with tylenol and she was ambulating without difficulty by the pod 0.  She was felt to be in stable condition for discharge on POD 2.  Consults: None  Significant Diagnostic Studies: labs: cbc, bmet  Treatments: IV hydration and surgery: robotic sigmoidectomy  Discharge Exam: Blood pressure 112/69, pulse 61, temperature 98.4 F (36.9 C), temperature source Oral, resp. rate 16, height 5\' 6"  (1.676 m), weight 81.1 kg (178 lb 12.7 oz), SpO2 98 %. General appearance: alert and cooperative GI: normal findings: soft, non-tender Incision/Wound: clean, dry, intact  Disposition: Home   Allergies as of 03/29/2018      Reactions   Codeine Nausea Only      Medication List    TAKE these medications   CINNAMON PLUS CHROMIUM PO Take 1 capsule by mouth 3 (three) times daily.   Co Q 10 100 MG Caps Take 100 mg by mouth daily.   lisinopril-hydrochlorothiazide 20-12.5 MG tablet Commonly known as:  PRINZIDE,ZESTORETIC Take 1 tablet by mouth daily.   MAGNESIUM PO Take 1 tablet by mouth every other day.   milk thistle 175 MG tablet Take 175 mg by mouth daily.      Follow-up Information    Leighton Ruff, MD. Schedule an appointment as soon as possible for a visit in 2 week(s).   Specialty:  General Surgery Contact information: [address]  [place] [address] [phone]           Signed: Rosario Adie 2/95/1884, 1:66 AM

## 2018-03-29 NOTE — Discharge Instructions (Signed)

## 2018-03-29 NOTE — Progress Notes (Signed)
Discharge instructions reviewed with patient. Questions answered and patient denies further questions. No prescriptions given to patient. Family member has been called by patient to drive her home. Donne Hazel, RN

## 2018-04-17 ENCOUNTER — Other Ambulatory Visit: Payer: BLUE CROSS/BLUE SHIELD

## 2018-04-17 ENCOUNTER — Ambulatory Visit: Payer: BLUE CROSS/BLUE SHIELD | Admitting: Hematology & Oncology

## 2018-04-26 ENCOUNTER — Other Ambulatory Visit: Payer: Self-pay

## 2018-04-26 DIAGNOSIS — D123 Benign neoplasm of transverse colon: Secondary | ICD-10-CM

## 2018-04-29 ENCOUNTER — Other Ambulatory Visit: Payer: Self-pay

## 2018-04-29 ENCOUNTER — Inpatient Hospital Stay (HOSPITAL_BASED_OUTPATIENT_CLINIC_OR_DEPARTMENT_OTHER): Payer: BLUE CROSS/BLUE SHIELD | Admitting: Hematology & Oncology

## 2018-04-29 ENCOUNTER — Inpatient Hospital Stay: Payer: BLUE CROSS/BLUE SHIELD | Attending: Hematology & Oncology

## 2018-04-29 ENCOUNTER — Encounter: Payer: Self-pay | Admitting: Hematology & Oncology

## 2018-04-29 VITALS — BP 146/83 | HR 77 | Temp 98.7°F | Resp 19 | Wt 177.0 lb

## 2018-04-29 DIAGNOSIS — C187 Malignant neoplasm of sigmoid colon: Secondary | ICD-10-CM | POA: Insufficient documentation

## 2018-04-29 DIAGNOSIS — D123 Benign neoplasm of transverse colon: Secondary | ICD-10-CM

## 2018-04-29 DIAGNOSIS — C185 Malignant neoplasm of splenic flexure: Secondary | ICD-10-CM

## 2018-04-29 LAB — CBC WITH DIFFERENTIAL (CANCER CENTER ONLY)
Basophils Absolute: 0 10*3/uL (ref 0.0–0.1)
Basophils Relative: 0 %
Eosinophils Absolute: 0.2 10*3/uL (ref 0.0–0.5)
Eosinophils Relative: 3 %
HCT: 40.3 % (ref 34.8–46.6)
Hemoglobin: 13.2 g/dL (ref 11.6–15.9)
Lymphocytes Relative: 25 %
Lymphs Abs: 1.5 10*3/uL (ref 0.9–3.3)
MCH: 30.8 pg (ref 26.0–34.0)
MCHC: 32.8 g/dL (ref 32.0–36.0)
MCV: 93.9 fL (ref 81.0–101.0)
Monocytes Absolute: 0.6 10*3/uL (ref 0.1–0.9)
Monocytes Relative: 10 %
Neutro Abs: 3.7 10*3/uL (ref 1.5–6.5)
Neutrophils Relative %: 62 %
Platelet Count: 227 10*3/uL (ref 145–400)
RBC: 4.29 MIL/uL (ref 3.70–5.32)
RDW: 13.6 % (ref 11.1–15.7)
WBC Count: 6 10*3/uL (ref 3.9–10.0)

## 2018-04-29 LAB — CMP (CANCER CENTER ONLY)
ALT: 30 U/L (ref 10–47)
ANION GAP: 6 (ref 5–15)
AST: 52 U/L — ABNORMAL HIGH (ref 11–38)
Albumin: 3.8 g/dL (ref 3.5–5.0)
Alkaline Phosphatase: 86 U/L — ABNORMAL HIGH (ref 26–84)
BILIRUBIN TOTAL: 0.6 mg/dL (ref 0.2–1.6)
BUN: 12 mg/dL (ref 7–22)
CHLORIDE: 103 mmol/L (ref 98–108)
CO2: 31 mmol/L (ref 18–33)
Calcium: 9.4 mg/dL (ref 8.0–10.3)
Creatinine: 0.9 mg/dL (ref 0.60–1.20)
GLUCOSE: 183 mg/dL — AB (ref 73–118)
POTASSIUM: 4.2 mmol/L (ref 3.3–4.7)
Sodium: 140 mmol/L (ref 128–145)
Total Protein: 7 g/dL (ref 6.4–8.1)

## 2018-04-29 NOTE — Progress Notes (Signed)
Hematology and Oncology Follow Up Visit  Sandra Mckee [id number] [birth year] 62 y.o. 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 Sandra Mckee is back for second office visit.  We saw her back in June.  At that time, she was just diagnosed with colon cancer.  She had not yet had surgery.  She went to surgery on March 27, 2018.  This was done by Dr. Marcello Moores.  The pathology report ([id number]) showed a invasive adenocarcinoma of the sigmoid colon.  The tumor was 4.6 cm.  There is no lymphovascular invasion.  She had a 39 lymph nodes that were NEGATIVE.  It was grade 2.  There is no perforation.  There was testing which showed MSI to be stable.  She had laparoscopic surgery.  She did quite well.  She was hospitalized for I think 3 days.  She is feeling well.  She has had no problems postop.  She is had no diarrhea.  She is had no cough or shortness of breath.  She is had no bleeding.  She has had no leg swelling.  I really do not think that she is in need of adjuvant therapy.  I just believe that the risk of recurrence for her is going to be less than 15%.  Overall, her performance status is ECOG 0.  Medications:  Current Outpatient Medications:  .  Chromium-Cinnamon (CINNAMON PLUS CHROMIUM PO), Take 1 capsule by mouth 3 (three) times daily., Disp: , Rfl:  .  Coenzyme Q10 (CO Q 10) 100 MG CAPS, Take 100 mg by mouth daily., Disp: , Rfl:  .  lisinopril-hydrochlorothiazide (PRINZIDE,ZESTORETIC) 20-12.5 MG tablet, Take 1 tablet by mouth daily., Disp: , Rfl:  .  MAGNESIUM PO, Take 1 tablet by mouth every other day., Disp: , Rfl:  .  milk thistle 175 MG tablet, Take 175 mg by mouth daily., Disp: , Rfl:   Current Facility-Administered Medications:  .  0.9 %  sodium chloride infusion, 500 mL, Intravenous, Once, Jackquline Denmark, MD  Allergies:  Allergies  Allergen  Reactions  . Codeine Nausea Only    Past Medical History, Surgical history, Social history, and Family History were reviewed and updated.  Review of Systems: Review of Systems  Constitutional: Negative.   HENT:  Negative.   Eyes: Negative.   Respiratory: Negative.   Cardiovascular: Negative.   Gastrointestinal: Negative.   Endocrine: Negative.   Genitourinary: Negative.    Musculoskeletal: Negative.   Skin: Negative.   Neurological: Negative.   Hematological: Negative.   Psychiatric/Behavioral: Negative.     Physical Exam:  weight is 177 lb (80.3 kg). Her oral temperature is 98.7 F (37.1 C). Her blood pressure is 146/83 (abnormal) and her pulse is 77. Her respiration is 19 and oxygen saturation is 97%.   Wt Readings from Last 3 Encounters:  04/29/18 177 lb (80.3 kg)  03/29/18 178 lb 12.7 oz (81.1 kg)  03/22/18 174 lb (78.9 kg)    Physical Exam  Constitutional: She is oriented to person, place, and time.  HENT:  Head: Normocephalic and atraumatic.  Mouth/Throat: Oropharynx is clear and moist.  Eyes: Pupils are equal, round, and reactive to light. EOM are normal.  Neck: Normal range of motion.  Cardiovascular: Normal rate, regular rhythm and normal heart sounds.  Pulmonary/Chest: Effort normal and breath sounds normal.  Abdominal: Soft. Bowel sounds are normal.  She has well-healed laparoscopy scars.  There is no fluid wave.  There is no guarding or rebound tenderness.  She has no palpable liver or spleen tip.  Musculoskeletal: Normal range of motion. She exhibits no edema, tenderness or deformity.  Lymphadenopathy:    She has no cervical adenopathy.  Neurological: She is alert and oriented to person, place, and time.  Skin: Skin is warm and dry. No rash noted. No erythema.  Psychiatric: She has a normal mood and affect. Her behavior is normal. Judgment and thought content normal.  Vitals reviewed.    Lab Results  Component Value Date   WBC 6.0 04/29/2018   HGB  13.2 04/29/2018   HCT 40.3 04/29/2018   MCV 93.9 04/29/2018   PLT 227 04/29/2018     Chemistry      Component Value Date/Time   NA 140 04/29/2018 1310   K 4.2 04/29/2018 1310   CL 103 04/29/2018 1310   CO2 31 04/29/2018 1310   BUN 12 04/29/2018 1310   CREATININE 0.90 04/29/2018 1310      Component Value Date/Time   CALCIUM 9.4 04/29/2018 1310   ALKPHOS 86 (H) 04/29/2018 1310   AST 52 (H) 04/29/2018 1310   ALT 30 04/29/2018 1310   BILITOT 0.6 04/29/2018 1310       Impression and Plan: Ms. Sandra Mckee is a 62 year old white female.  She has a stage II adenocarcinoma of the distal sigmoid colon.  She is good risk from what I can tell by the pathology report.  I think the most important factor is that she had 39- lymph nodes.  I just do not see a role for adjuvant therapy for Ms. Sandra Mckee.  I just do not believe that she would benefit from adjuvant therapy.  I reviewed the pathology report with her.  I went over the features that I thought were quite encouraging.  she is MSI stable which also is a good sign.  I think we have to get on surveillance now.  I think that a CT scan of the abdomen pelvis can be done in 4 months.  I think that every 4-6 months would be reasonable.  She had a normal CA level when I first saw her.  As such, this also is a good prognostic marker.  We will plan to see her back in December.  We will do a CT scan when we see her back.  I am very impressed with the surgical work of Dr. Marcello Moores.  She did a fantastic job with Ms. Sandra Mckee.   [name], MD 8/12/20192:57 PM

## 2018-04-30 LAB — CEA (IN HOUSE-CHCC): CEA (CHCC-In House): 1.44 ng/mL (ref 0.00–5.00)

## 2018-08-22 ENCOUNTER — Inpatient Hospital Stay: Payer: BLUE CROSS/BLUE SHIELD | Attending: Hematology & Oncology | Admitting: Hematology & Oncology

## 2018-08-22 ENCOUNTER — Encounter (HOSPITAL_BASED_OUTPATIENT_CLINIC_OR_DEPARTMENT_OTHER): Payer: Self-pay

## 2018-08-22 ENCOUNTER — Encounter: Payer: Self-pay | Admitting: Hematology & Oncology

## 2018-08-22 ENCOUNTER — Ambulatory Visit (HOSPITAL_BASED_OUTPATIENT_CLINIC_OR_DEPARTMENT_OTHER)
Admission: RE | Admit: 2018-08-22 | Discharge: 2018-08-22 | Disposition: A | Discharge status: Home / Self Care | Payer: BLUE CROSS/BLUE SHIELD | Source: Ambulatory Visit | Attending: Hematology & Oncology | Admitting: Hematology & Oncology

## 2018-08-22 ENCOUNTER — Inpatient Hospital Stay: Payer: BLUE CROSS/BLUE SHIELD

## 2018-08-22 VITALS — BP 144/85 | HR 79 | Temp 98.4°F | Resp 16 | Wt 175.8 lb

## 2018-08-22 DIAGNOSIS — C185 Malignant neoplasm of splenic flexure: Secondary | ICD-10-CM | POA: Insufficient documentation

## 2018-08-22 DIAGNOSIS — C187 Malignant neoplasm of sigmoid colon: Secondary | ICD-10-CM | POA: Diagnosis present

## 2018-08-22 LAB — CMP (CANCER CENTER ONLY)
ALBUMIN: 4.6 g/dL (ref 3.5–5.0)
ALT: 17 U/L (ref 0–44)
ANION GAP: 9 (ref 5–15)
AST: 35 U/L (ref 15–41)
Alkaline Phosphatase: 71 U/L (ref 38–126)
BILIRUBIN TOTAL: 0.7 mg/dL (ref 0.3–1.2)
BUN: 17 mg/dL (ref 8–23)
CHLORIDE: 95 mmol/L — AB (ref 98–111)
CO2: 27 mmol/L (ref 22–32)
Calcium: 9.5 mg/dL (ref 8.9–10.3)
Creatinine: 0.77 mg/dL (ref 0.44–1.00)
GFR, Est AFR Am: >60 mL/min (ref >60)
GFR, Estimated: >60 mL/min (ref >60)
GLUCOSE: 186 mg/dL — AB (ref 70–99)
POTASSIUM: 3.9 mmol/L (ref 3.5–5.1)
Sodium: 131 mmol/L — ABNORMAL LOW (ref 135–145)
TOTAL PROTEIN: 7.1 g/dL (ref 6.5–8.1)

## 2018-08-22 LAB — CBC WITH DIFFERENTIAL (CANCER CENTER ONLY)
Abs Immature Granulocytes: 0.03 10*3/uL (ref 0.00–0.07)
BASOS ABS: 0 10*3/uL (ref 0.0–0.1)
Basophils Relative: 0 %
EOS ABS: 0.2 10*3/uL (ref 0.0–0.5)
EOS PCT: 2 %
HEMATOCRIT: 42.7 % (ref 36.0–46.0)
Hemoglobin: 13.8 g/dL (ref 12.0–15.0)
IMMATURE GRANULOCYTES: 0 %
LYMPHS ABS: 1.5 10*3/uL (ref 0.7–4.0)
Lymphocytes Relative: 15 %
MCH: 29.6 pg (ref 26.0–34.0)
MCHC: 32.3 g/dL (ref 30.0–36.0)
MCV: 91.6 fL (ref 80.0–100.0)
Monocytes Absolute: 0.9 10*3/uL (ref 0.1–1.0)
Monocytes Relative: 9 %
NRBC: 0 % (ref 0.0–0.2)
Neutro Abs: 7 10*3/uL (ref 1.7–7.7)
Neutrophils Relative %: 74 %
Platelet Count: 229 10*3/uL (ref 150–400)
RBC: 4.66 MIL/uL (ref 3.87–5.11)
RDW: 13.2 % (ref 11.5–15.5)
WBC: 9.6 10*3/uL (ref 4.0–10.5)

## 2018-08-22 MED ORDER — IOPAMIDOL (ISOVUE-300) INJECTION 61%
100.0000 mL | Freq: Once | INTRAVENOUS | Status: AC | PRN
  Administered 2018-08-22: 100 mL via INTRAVENOUS

## 2018-08-22 NOTE — Progress Notes (Signed)
Hematology and Oncology Follow Up Visit  Christiane Sistare [id number] [birth year] 62 y.o. 08/22/2018   Principle Diagnosis:   Stage II (T3N0M0) adenocarcinoma of the sigmoid colon -- 39/39 nodes (-)/MSI stable --surgery on 03/27/2018  Current Therapy:    Observation     Interim History:  Ms. Gadsby is back for follow-up.  She had a very nice Thanksgiving.  A lot of her family came in.  She really enjoyed having her family with her.  She has a cold.  She says she got this from the grandkids.  It is an upper respiratory issue.  She just has a lot of congestion.   She did have a CT scan done today.  Unfortunately, I do not have the results back yet.  She is eating well.  She is not having any problems with her bowels or bladder.  There is no abdominal pain.  She is watching her blood sugars.  She is having no issues with diarrhea.  There is no problems with constipation.  She has not noted any leg swelling.  She is exercising.  She is trying to maintain her weight.  Overall, her performance status is ECOG 1.   Medications:  Current Outpatient Medications:  .  Chromium-Cinnamon (CINNAMON PLUS CHROMIUM PO), Take 1 capsule by mouth 3 (three) times daily., Disp: , Rfl:  .  Coenzyme Q10 (CO Q 10) 100 MG CAPS, Take 100 mg by mouth daily., Disp: , Rfl:  .  lisinopril-hydrochlorothiazide (PRINZIDE,ZESTORETIC) 20-12.5 MG tablet, Take 1 tablet by mouth daily., Disp: , Rfl:  .  MAGNESIUM PO, Take 1 tablet by mouth every other day., Disp: , Rfl:  .  milk thistle 175 MG tablet, Take 175 mg by mouth daily., Disp: , Rfl:   Current Facility-Administered Medications:  .  0.9 %  sodium chloride infusion, 500 mL, Intravenous, Once, Jackquline Denmark, MD  Allergies:  Allergies  Allergen Reactions  . Codeine Nausea Only    Past Medical History, Surgical history, Social history, and Family History were reviewed and updated.  Review of Systems: Review of Systems  Constitutional: Negative.   HENT:   Negative.   Eyes: Negative.   Respiratory: Negative.   Cardiovascular: Negative.   Gastrointestinal: Negative.   Endocrine: Negative.   Genitourinary: Negative.    Musculoskeletal: Negative.   Skin: Negative.   Neurological: Negative.   Hematological: Negative.   Psychiatric/Behavioral: Negative.     Physical Exam:  weight is 175 lb 12.8 oz (79.7 kg). Her oral temperature is 98.4 F (36.9 C). Her blood pressure is 144/85 (abnormal) and her pulse is 79. Her respiration is 16 and oxygen saturation is 97%.   Wt Readings from Last 3 Encounters:  08/22/18 175 lb 12.8 oz (79.7 kg)  04/29/18 177 lb (80.3 kg)  03/29/18 178 lb 12.7 oz (81.1 kg)    Physical Exam  Constitutional: She is oriented to person, place, and time.  HENT:  Head: Normocephalic and atraumatic.  Mouth/Throat: Oropharynx is clear and moist.  Eyes: Pupils are equal, round, and reactive to light. EOM are normal.  Neck: Normal range of motion.  Cardiovascular: Normal rate, regular rhythm and normal heart sounds.  Pulmonary/Chest: Effort normal and breath sounds normal.  Abdominal: Soft. Bowel sounds are normal.  She has well-healed laparoscopy scars.  There is no fluid wave.  There is no guarding or rebound tenderness.  She has no palpable liver or spleen tip.  Musculoskeletal: Normal range of motion. She exhibits no edema, tenderness or deformity.  Lymphadenopathy:  She has no cervical adenopathy.  Neurological: She is alert and oriented to person, place, and time.  Skin: Skin is warm and dry. No rash noted. No erythema.  Psychiatric: She has a normal mood and affect. Her behavior is normal. Judgment and thought content normal.  Vitals reviewed.    Lab Results  Component Value Date   WBC 9.6 08/22/2018   HGB 13.8 08/22/2018   HCT 42.7 08/22/2018   MCV 91.6 08/22/2018   PLT 229 08/22/2018     Chemistry      Component Value Date/Time   NA 131 (L) 08/22/2018 0859   K 3.9 08/22/2018 0859   CL 95 (L)  08/22/2018 0859   CO2 27 08/22/2018 0859   BUN 17 08/22/2018 0859   CREATININE 0.77 08/22/2018 0859      Component Value Date/Time   CALCIUM 9.5 08/22/2018 0859   ALKPHOS 71 08/22/2018 0859   AST 35 08/22/2018 0859   ALT 17 08/22/2018 0859   BILITOT 0.7 08/22/2018 0859       Impression and Plan: Ms. Ohalloran is a 62 year old white female.  She has a stage II adenocarcinoma of the distal sigmoid colon.  She is good risk from what I can tell by the pathology report.  I think the most important factor is that she had 39- lymph nodes.  I just do not see a role for adjuvant therapy for Ms. Blades.  I just do not believe that she would benefit from adjuvant therapy.  I will plan for another CT scan in 4 months.  If that CT scan looks good, then I think we can probably move her scans out to every 6 months.  I am just happy that she is doing so well.  Her blood sugar was on the high side.  She will monitor this and keep this under control.  I will see her back in 4 months when she has her CT scan done.  [name], MD 12/5/201910:25 AM

## 2018-08-23 ENCOUNTER — Telehealth: Payer: Self-pay | Admitting: *Deleted

## 2018-08-23 LAB — CEA (IN HOUSE-CHCC): CEA (CHCC-IN HOUSE): 2.02 ng/mL (ref 0.00–5.00)

## 2018-08-23 NOTE — Telephone Encounter (Addendum)
Patient is aware of results.   ----- Message from [name], MD sent at 08/22/2018  5:42 PM EST ----- Call - the CT scan does NOT show any cancer!!  Sandra Mckee

## 2019-01-22 ENCOUNTER — Telehealth: Payer: Self-pay | Admitting: Hematology & Oncology

## 2019-01-22 NOTE — Telephone Encounter (Signed)
Patient called to cancel & reschedule appointments out until June 2020/  Appts moved as requested by patient

## 2019-01-24 ENCOUNTER — Ambulatory Visit: Payer: BLUE CROSS/BLUE SHIELD | Admitting: Hematology & Oncology

## 2019-01-24 ENCOUNTER — Other Ambulatory Visit (HOSPITAL_BASED_OUTPATIENT_CLINIC_OR_DEPARTMENT_OTHER): Payer: BLUE CROSS/BLUE SHIELD

## 2019-01-24 ENCOUNTER — Other Ambulatory Visit: Payer: BLUE CROSS/BLUE SHIELD

## 2019-03-04 ENCOUNTER — Ambulatory Visit: Payer: Self-pay | Admitting: Hematology & Oncology

## 2019-03-04 ENCOUNTER — Other Ambulatory Visit: Payer: Self-pay

## 2019-03-06 ENCOUNTER — Telehealth: Payer: Self-pay | Admitting: *Deleted

## 2019-03-06 ENCOUNTER — Encounter: Payer: Self-pay | Admitting: Hematology & Oncology

## 2019-03-06 ENCOUNTER — Ambulatory Visit (HOSPITAL_BASED_OUTPATIENT_CLINIC_OR_DEPARTMENT_OTHER)
Admission: RE | Admit: 2019-03-06 | Discharge: 2019-03-06 | Disposition: A | Discharge status: Home / Self Care | Payer: Medicare Other | Source: Ambulatory Visit | Attending: Hematology & Oncology | Admitting: Hematology & Oncology

## 2019-03-06 ENCOUNTER — Other Ambulatory Visit: Payer: Self-pay

## 2019-03-06 ENCOUNTER — Inpatient Hospital Stay: Payer: Medicare Other | Attending: Hematology & Oncology

## 2019-03-06 ENCOUNTER — Inpatient Hospital Stay (HOSPITAL_BASED_OUTPATIENT_CLINIC_OR_DEPARTMENT_OTHER): Payer: Medicare Other | Admitting: Hematology & Oncology

## 2019-03-06 ENCOUNTER — Encounter (HOSPITAL_BASED_OUTPATIENT_CLINIC_OR_DEPARTMENT_OTHER): Payer: Self-pay

## 2019-03-06 VITALS — BP 152/89 | HR 80 | Temp 99.0°F | Resp 18 | Wt 174.0 lb

## 2019-03-06 DIAGNOSIS — C187 Malignant neoplasm of sigmoid colon: Secondary | ICD-10-CM | POA: Diagnosis not present

## 2019-03-06 DIAGNOSIS — C185 Malignant neoplasm of splenic flexure: Secondary | ICD-10-CM

## 2019-03-06 DIAGNOSIS — C189 Malignant neoplasm of colon, unspecified: Secondary | ICD-10-CM | POA: Diagnosis not present

## 2019-03-06 LAB — CMP (CANCER CENTER ONLY)
ALT: 38 U/L (ref 0–44)
AST: 112 U/L — ABNORMAL HIGH (ref 15–41)
Albumin: 4.9 g/dL (ref 3.5–5.0)
Alkaline Phosphatase: 103 U/L (ref 38–126)
Anion gap: 13 (ref 5–15)
BUN: 16 mg/dL (ref 8–23)
CO2: 23 mmol/L (ref 22–32)
Calcium: 10 mg/dL (ref 8.9–10.3)
Chloride: 99 mmol/L (ref 98–111)
Creatinine: 0.81 mg/dL (ref 0.44–1.00)
GFR, Est AFR Am: >60 mL/min (ref >60)
GFR, Estimated: >60 mL/min (ref >60)
Glucose, Bld: 198 mg/dL — ABNORMAL HIGH (ref 70–99)
Potassium: 4 mmol/L (ref 3.5–5.1)
Sodium: 135 mmol/L (ref 135–145)
Total Bilirubin: 0.8 mg/dL (ref 0.3–1.2)
Total Protein: 7.6 g/dL (ref 6.5–8.1)

## 2019-03-06 LAB — CBC WITH DIFFERENTIAL (CANCER CENTER ONLY)
Abs Immature Granulocytes: 0.01 10*3/uL (ref 0.00–0.07)
Basophils Absolute: 0 10*3/uL (ref 0.0–0.1)
Basophils Relative: 1 %
Eosinophils Absolute: 0.1 10*3/uL (ref 0.0–0.5)
Eosinophils Relative: 2 %
HCT: 45.6 % (ref 36.0–46.0)
Hemoglobin: 15.2 g/dL — ABNORMAL HIGH (ref 12.0–15.0)
Immature Granulocytes: 0 %
Lymphocytes Relative: 21 %
Lymphs Abs: 1.2 10*3/uL (ref 0.7–4.0)
MCH: 31.3 pg (ref 26.0–34.0)
MCHC: 33.3 g/dL (ref 30.0–36.0)
MCV: 94 fL (ref 80.0–100.0)
Monocytes Absolute: 0.4 10*3/uL (ref 0.1–1.0)
Monocytes Relative: 7 %
Neutro Abs: 3.9 10*3/uL (ref 1.7–7.7)
Neutrophils Relative %: 69 %
Platelet Count: 164 10*3/uL (ref 150–400)
RBC: 4.85 MIL/uL (ref 3.87–5.11)
RDW: 12.5 % (ref 11.5–15.5)
WBC Count: 5.7 10*3/uL (ref 4.0–10.5)
nRBC: 0 % (ref 0.0–0.2)

## 2019-03-06 LAB — CEA (IN HOUSE-CHCC): CEA (CHCC-In House): 1.94 ng/mL (ref 0.00–5.00)

## 2019-03-06 MED ORDER — IOHEXOL 300 MG/ML  SOLN
100.0000 mL | Freq: Once | INTRAMUSCULAR | Status: AC | PRN
  Administered 2019-03-06: 100 mL via INTRAVENOUS

## 2019-03-06 NOTE — Telephone Encounter (Signed)
-----   Message from [name], MD sent at 03/06/2019  2:04 PM EDT ----- Call - NO cancer on the CT scan!!  [facility]

## 2019-03-06 NOTE — Progress Notes (Signed)
Hematology and Oncology Follow Up Visit  Sandra Mckee [id number] May 20, 1956 63 y.o. 03/06/2019   Principle Diagnosis:   Stage II (T3N0M0) adenocarcinoma of the sigmoid colon -- 39/39 nodes (-)/MSI stable --surgery on 03/27/2018  Current Therapy:    Observation     Interim History:  Sandra Mckee is back for follow-up.  Sandra Mckee is doing pretty well.  Sandra Mckee is stress right now.  Sandra Mckee is trying to help out her family.  Apparently, her daughter and son-in-law are fostering some young children.  1 of the little girls was found to have a medulloblastoma.  Sandra Mckee has been treated down in [place].  Again, Sandra Mckee is trying to help out her daughter as much as possible.  As far as her colon cancer is concerned, Sandra Mckee is doing quite well with this.  Sandra Mckee had a CT scan done today.  The CT scan did not show any evidence of residual or recurrent colon cancer.  Her CEA level done today was only 1.94.  Sandra Mckee has a lot of abdominal upset.  I think this is probably from stress.  Sandra Mckee has had no bleeding.  Sandra Mckee has had no fever.  Sandra Mckee is had no nausea or vomiting.  Overall, her performance status is ECOG 1.   Medications:  Current Outpatient Medications:  .  Chromium-Cinnamon (CINNAMON PLUS CHROMIUM PO), Take 1 capsule by mouth 3 (three) times daily., Disp: , Rfl:  .  Coenzyme Q10 (CO Q 10) 100 MG CAPS, Take 100 mg by mouth daily., Disp: , Rfl:  .  lisinopril-hydrochlorothiazide (PRINZIDE,ZESTORETIC) 20-12.5 MG tablet, Take 1 tablet by mouth daily., Disp: , Rfl:  .  MAGNESIUM PO, Take 1 tablet by mouth every other day., Disp: , Rfl:  .  milk thistle 175 MG tablet, Take 175 mg by mouth daily., Disp: , Rfl:   Current Facility-Administered Medications:  .  0.9 %  sodium chloride infusion, 500 mL, Intravenous, Once, Jackquline Denmark, MD  Allergies:  Allergies  Allergen Reactions  . Codeine Nausea Only    Past Medical History, Surgical history, Social history, and Family History were reviewed and updated.  Review of  Systems: Review of Systems  Constitutional: Negative.   HENT:  Negative.   Eyes: Negative.   Respiratory: Negative.   Cardiovascular: Negative.   Gastrointestinal: Negative.   Endocrine: Negative.   Genitourinary: Negative.    Musculoskeletal: Negative.   Skin: Negative.   Neurological: Negative.   Hematological: Negative.   Psychiatric/Behavioral: Negative.     Physical Exam:  vitals were not taken for this visit.   Wt Readings from Last 3 Encounters:  08/22/18 175 lb 12.8 oz (79.7 kg)  04/29/18 177 lb (80.3 kg)  03/29/18 178 lb 12.7 oz (81.1 kg)    Physical Exam Vitals signs reviewed.  HENT:     Head: Normocephalic and atraumatic.  Eyes:     Pupils: Pupils are equal, round, and reactive to light.  Neck:     Musculoskeletal: Normal range of motion.  Cardiovascular:     Rate and Rhythm: Normal rate and regular rhythm.     Heart sounds: Normal heart sounds.  Pulmonary:     Effort: Pulmonary effort is normal.     Breath sounds: Normal breath sounds.  Abdominal:     General: Bowel sounds are normal.     Palpations: Abdomen is soft.     Comments: Sandra Mckee has well-healed laparoscopy scars.  There is no fluid wave.  There is no guarding or rebound tenderness.  Sandra Mckee has  no palpable liver or spleen tip.  Musculoskeletal: Normal range of motion.        General: No tenderness or deformity.  Lymphadenopathy:     Cervical: No cervical adenopathy.  Skin:    General: Skin is warm and dry.     Findings: No erythema or rash.  Neurological:     Mental Status: Sandra Mckee is alert and oriented to person, place, and time.  Psychiatric:        Behavior: Behavior normal.        Thought Content: Thought content normal.        Judgment: Judgment normal.      Lab Results  Component Value Date   WBC 5.7 03/06/2019   HGB 15.2 (H) 03/06/2019   HCT 45.6 03/06/2019   MCV 94.0 03/06/2019   PLT 164 03/06/2019     Chemistry      Component Value Date/Time   NA 135 03/06/2019 0937   K 4.0  03/06/2019 0937   CL 99 03/06/2019 0937   CO2 23 03/06/2019 0937   BUN 16 03/06/2019 0937   CREATININE 0.81 03/06/2019 0937      Component Value Date/Time   CALCIUM 10.0 03/06/2019 0937   ALKPHOS 103 03/06/2019 0937   AST 112 (H) 03/06/2019 0937   ALT 38 03/06/2019 0937   BILITOT 0.8 03/06/2019 0937       Impression and Plan: Sandra Mckee is a 63 year old white female.  Sandra Mckee has a stage II adenocarcinoma of the distal sigmoid colon.  Sandra Mckee is good risk from what I can tell by the pathology report.  I think the most important factor is that Sandra Mckee had 39- lymph nodes.  I think that we can probably get her back in 6 months now.  I will do another CT scan when I see her back.  I think if this CT scan looks okay, then maybe we can let her go and not have any further scans done.  I will certainly stay strong and prayer for her daughter's foster daughter.  [name], MD 6/18/202011:11 AM

## 2019-03-06 NOTE — Telephone Encounter (Signed)
Pt notified per order of Dr. Marin Olp that there is no cancer on the CT scan.  Pt appreciative of call and has no questions or concerns at this time.

## 2019-04-11 ENCOUNTER — Encounter: Payer: Self-pay | Admitting: Gastroenterology

## 2019-09-04 ENCOUNTER — Other Ambulatory Visit: Payer: Medicare Other

## 2019-09-04 ENCOUNTER — Ambulatory Visit: Payer: Medicare Other | Admitting: Hematology & Oncology

## 2020-02-05 IMAGING — CT CT ABDOMEN AND PELVIS WITH CONTRAST
2 of 5 series · 15 of 46 positions shown, 17 images · IV contrast (APPLIED)
Comparison: 08/22/2018

CLINICAL DATA: Restaging colon cancer. Status post sigmoid colon
resection.

EXAM:
CT ABDOMEN AND PELVIS WITH CONTRAST
TECHNIQUE: Multidetector CT imaging of the abdomen and pelvis was performed
using the standard protocol following bolus administration of
intravenous contrast.
CONTRAST:  100mL OMNIPAQUE IOHEXOL 300 MG/ML  SOLN

[Series 2: axial st · axial · 0.83mm/px · z∈[-459,-14]mm · 12 of 101 slices shown, 14 images]
[im 6/101  soft-tissue]
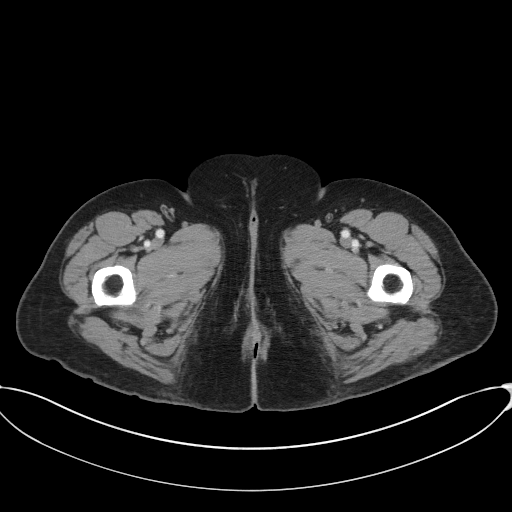
[im 6/101  bone]
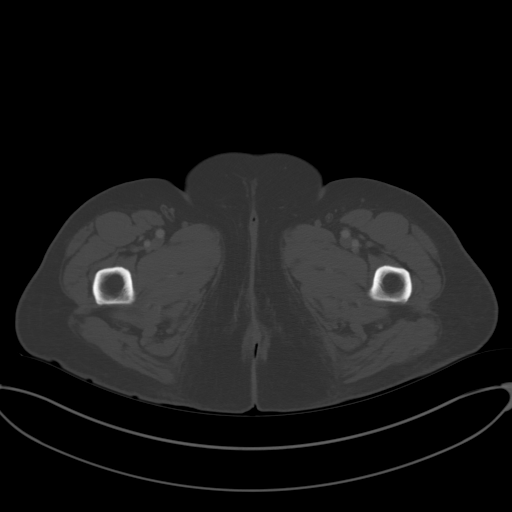
[im 17/101  soft-tissue]
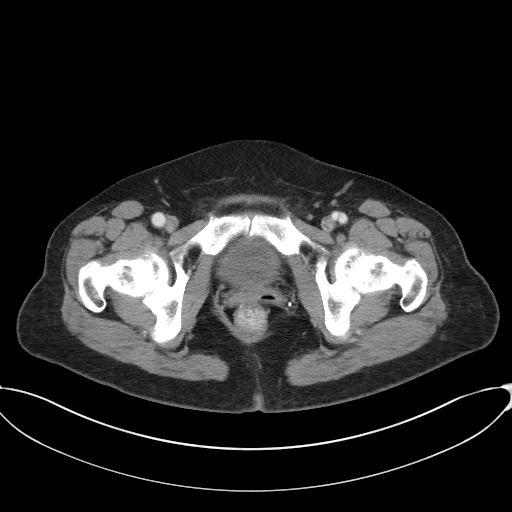
[im 23/101  soft-tissue]
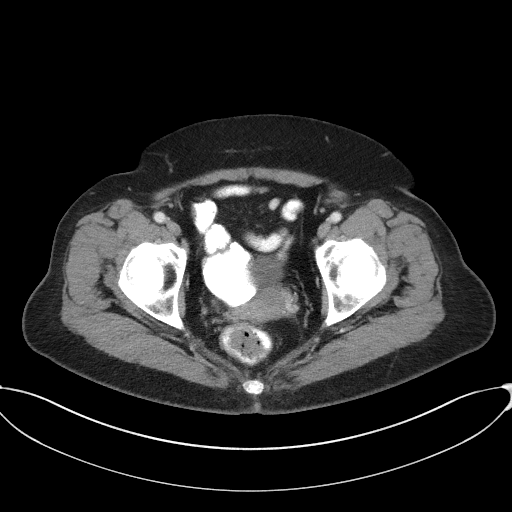
[im 28/101  soft-tissue]
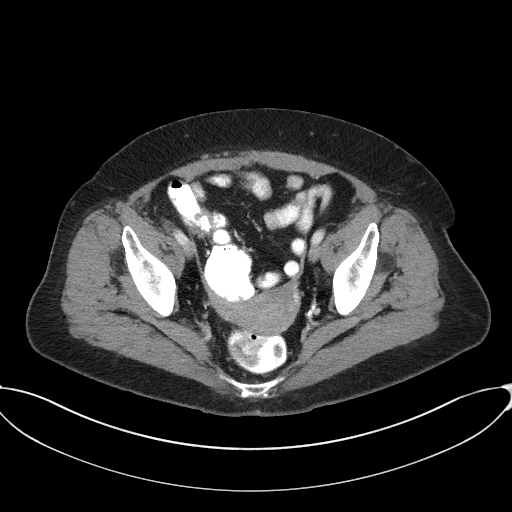
[im 39/101  soft-tissue]
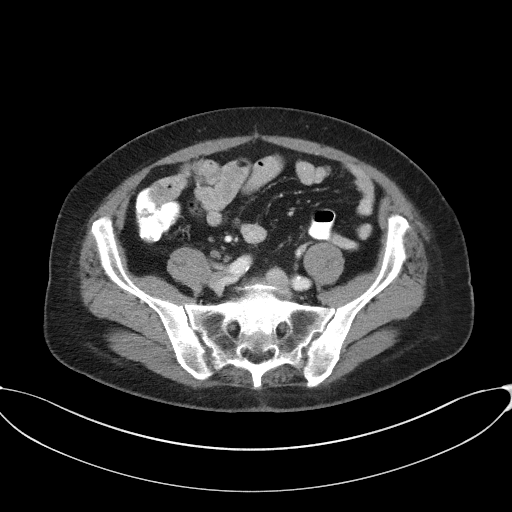
[im 45/101  soft-tissue]
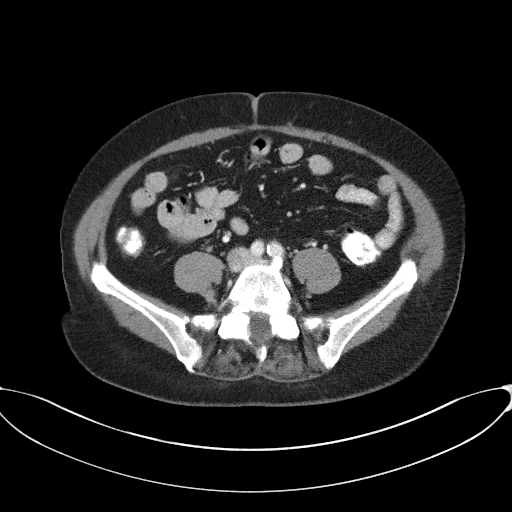
[im 56/101  soft-tissue]
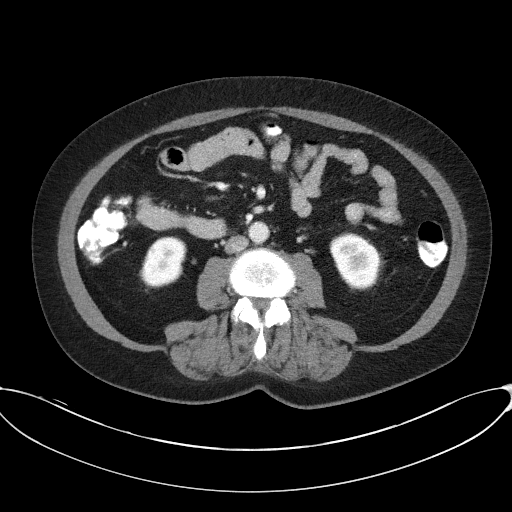
[im 62/101  soft-tissue]
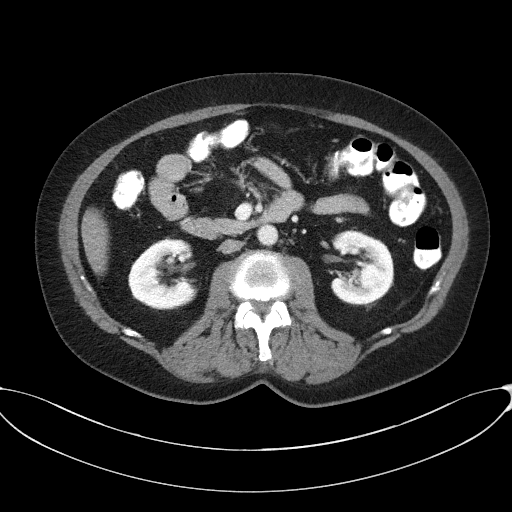
[im 73/101  soft-tissue]
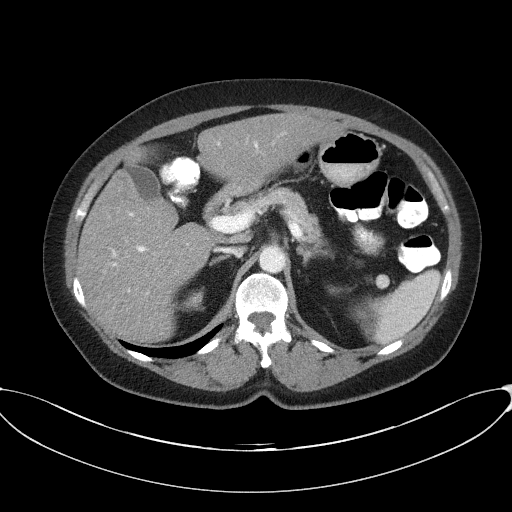
[im 73/101  bone]
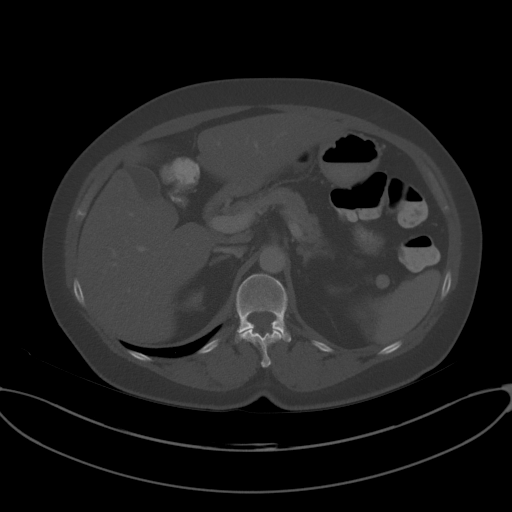
[im 78/101  soft-tissue]
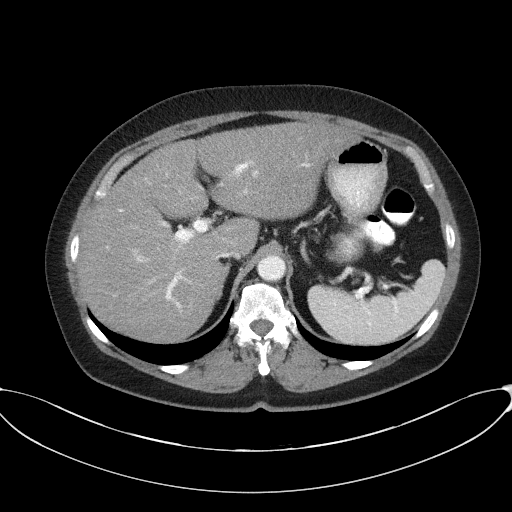
[im 84/101  soft-tissue]
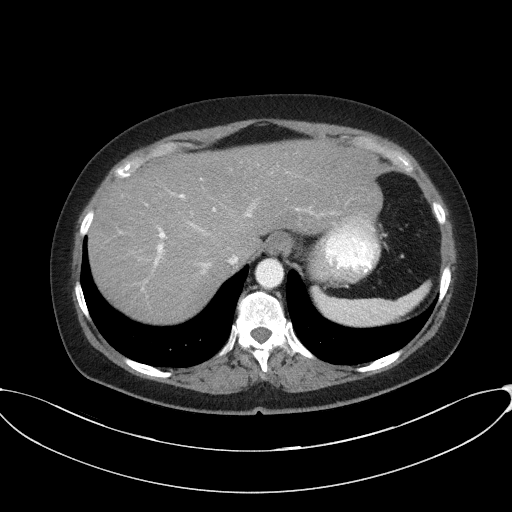
[im 95/101  soft-tissue]
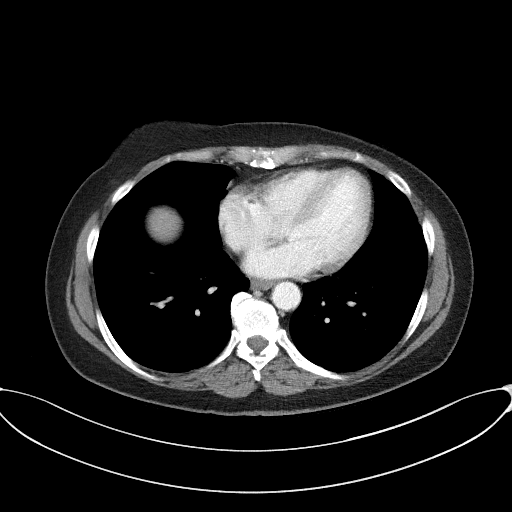

[Series 5: coronal st · coronal · 0.79mm/px · 3 of 96 slices shown]
[im 32/96  soft-tissue]
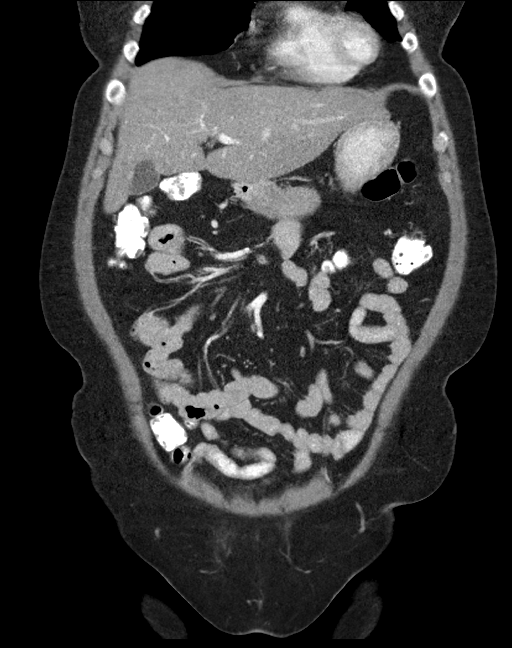
[im 43/96  soft-tissue]
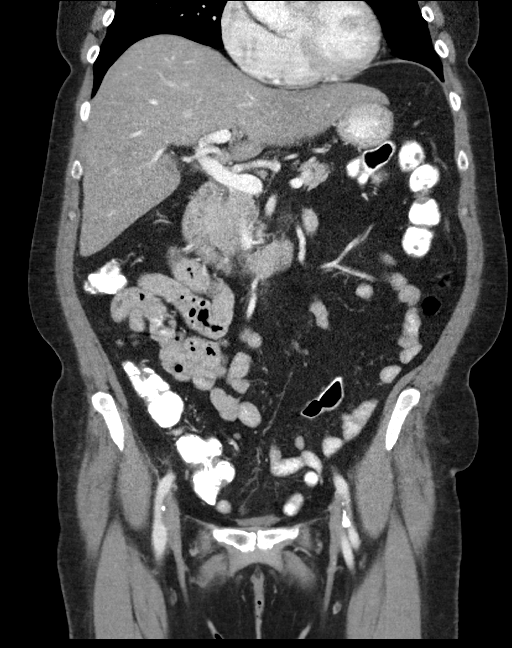
[im 53/96  soft-tissue]
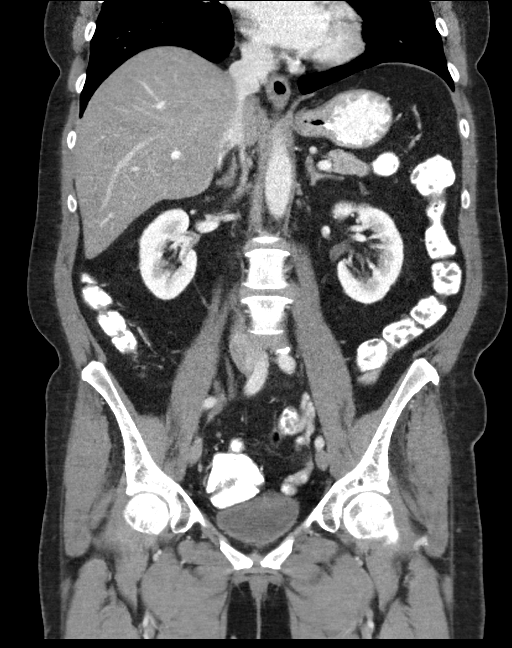

[15 of 46 positions shown; findings below may reference images not displayed]

FINDINGS: Lower chest: The lung bases are clear of acute process. No pleural
effusion or pulmonary lesions. The heart is normal in size. No
pericardial effusion. The distal esophagus and aorta are
unremarkable.

Hepatobiliary: Mild diffuse fatty infiltration of the liver but no
focal hepatic lesions to suggest metastatic disease. The gallbladder
is normal. No intra or extrahepatic biliary dilatation.

Pancreas: No mass, inflammation or ductal dilatation.

Spleen: Normal size.  No focal lesions.

Adrenals/Urinary Tract: The adrenal glands and kidneys are
unremarkable and stable. The bladder is normal.

Stomach/Bowel: The stomach, duodenum, small bowel and colon are
unremarkable. Stable surgical changes involving the sigmoid colon
from prior colon cancer resection. No findings for residual or
recurrent tumor. No new lesions or obstructive findings.

Vascular/Lymphatic: Stable vascular calcifications but no aneurysm
or dissection. The branch vessels are patent. The major venous
structures are patent.

Few small scattered stable mesenteric and retroperitoneal lymph
nodes but no new or progressive findings.

10 mm celiac axis node on image number 28.

10 mm paraduodenal lymph node on image number 31.

5 mm mesenteric node on image number 39

5 mm mesenteric node on image number 47.

No retroperitoneal adenopathy and no enlarged nodes in the sigmoid
mesocolon.

Reproductive: The uterus and ovaries are unremarkable.

Other: No free pelvic fluid collections. No inguinal mass or
adenopathy. Stable inguinal hernias containing fat. No subcutaneous
lesions.

Musculoskeletal: No significant bony findings.
IMPRESSION: 1. Stable surgical changes from sigmoid colon cancer resection. No
findings for residual or recurrent tumor, locoregional adenopathy or
metastatic disease elsewhere.
2. Stable upper abdominal and mesenteric lymph nodes. Recommend
continued observation.
3. Diffuse fatty infiltration of the liver.

## 2023-12-27 ENCOUNTER — Ambulatory Visit (HOSPITAL_BASED_OUTPATIENT_CLINIC_OR_DEPARTMENT_OTHER)
Admission: EM | Admit: 2023-12-27 | Discharge: 2023-12-27 | Disposition: A | Discharge status: Home / Self Care | Attending: Emergency Medicine | Admitting: Emergency Medicine

## 2023-12-27 ENCOUNTER — Encounter (HOSPITAL_BASED_OUTPATIENT_CLINIC_OR_DEPARTMENT_OTHER): Payer: Self-pay | Admitting: Emergency Medicine

## 2023-12-27 DIAGNOSIS — Z7984 Long term (current) use of oral hypoglycemic drugs: Secondary | ICD-10-CM | POA: Diagnosis not present

## 2023-12-27 DIAGNOSIS — M545 Low back pain, unspecified: Secondary | ICD-10-CM | POA: Diagnosis present

## 2023-12-27 DIAGNOSIS — N3000 Acute cystitis without hematuria: Secondary | ICD-10-CM | POA: Diagnosis present

## 2023-12-27 DIAGNOSIS — E119 Type 2 diabetes mellitus without complications: Secondary | ICD-10-CM | POA: Diagnosis not present

## 2023-12-27 DIAGNOSIS — R300 Dysuria: Secondary | ICD-10-CM | POA: Diagnosis present

## 2023-12-27 LAB — POCT URINALYSIS DIP (MANUAL ENTRY)
Bilirubin, UA: NEGATIVE
Glucose, UA: NEGATIVE mg/dL
Ketones, POC UA: NEGATIVE mg/dL
Nitrite, UA: POSITIVE — AB
Spec Grav, UA: 1.02
Urobilinogen, UA: 0.2 U/dL
pH, UA: 6

## 2023-12-27 MED ORDER — CEPHALEXIN 500 MG PO CAPS: 500.0000 mg | ORAL_CAPSULE | Freq: Two times a day (BID) | ORAL | 0 refills | Status: AC

## 2023-12-27 NOTE — ED Triage Notes (Signed)
 Pt c/o urinary frequency, burning, lower back pain x 3 days ago. Pt tried to take OTC medication like oregano oil but no relieve.

## 2023-12-27 NOTE — ED Provider Notes (Signed)
 [facility]    CSN: [id number] Arrival date & time: 12/27/23  0817      History   Chief Complaint Chief Complaint  Patient presents with   Urinary Frequency   Dysuria    HPI Sandra Mckee is a 68 y.o. female.   Patient presents to clinic over concerns of urinary frequency, urgency and dysuria with low back pain that has been ongoing for the past 3 days.  She did try multiple over-the-counter interventions such as water with baking soda, oregano oil and Azo.  She has not had a urinary tract infection in the last 5 years.  She is a type II diabetic and reports her blood sugars have been well-controlled at home.  Denies fevers, abdominal pain, nausea, vomiting or hematuria.  The history is provided by the patient.  Urinary Frequency  Dysuria   Past Medical History:  Diagnosis Date   Anal fissure 1965   Anemia    Arthritis    fingers, hand, lower back   Colon cancer (HCC)    Complete hearing loss of right ear    Glaucoma    Hypertension    Pre-diabetes     Patient Active Problem List   Diagnosis Date Noted   Colon cancer (HCC) 03/27/2018    Past Surgical History:  Procedure Laterality Date   APPENDECTOMY  1974   MENISCUS REPAIR Right    TUBAL LIGATION  1980    OB History   No obstetric history on file.      Home Medications    Prior to Admission medications   Medication Sig Start Date End Date Taking? Authorizing Provider  cephALEXin (KEFLEX) 500 MG capsule Take 1 capsule (500 mg total) by mouth 2 (two) times daily for 7 days. 12/27/23 01/03/24 Yes Rinaldo Ratel, [place] N, FNP  glimepiride (AMARYL) 1 MG tablet Take 1 mg by mouth every morning. 10/15/23  Yes [provider]  lisinopril-hydrochlorothiazide (PRINZIDE,ZESTORETIC) 20-12.5 MG tablet Take 1 tablet by mouth daily.   Yes [provider]  Chromium-Cinnamon (CINNAMON PLUS CHROMIUM PO) Take 1 capsule by mouth 3 (three) times daily.    [provider]  Coenzyme Q10  (CO Q 10) 100 MG CAPS Take 100 mg by mouth daily.    [provider]  MAGNESIUM PO Take 1 tablet by mouth every other day.    [provider]  milk thistle 175 MG tablet Take 175 mg by mouth daily.    [provider]    Family History Family History  Problem Relation Age of Onset   Heart disease Mother    Colon polyps Father    Cirrhosis Father    Colon cancer Neg Hx    Esophageal cancer Neg Hx    Prostate cancer Neg Hx    Rectal cancer Neg Hx     Social History Social History   Tobacco Use   Smoking status: Never   Smokeless tobacco: Never  Vaping Use   Vaping status: Never Used  Substance Use Topics   Alcohol use: Yes    Comment: very seldom    Drug use: Never     Allergies   Codeine   Review of Systems Review of Systems  Per HPI  Physical Exam Triage Vital Signs ED Triage Vitals  Encounter Vitals Group     BP 12/27/23 0827 136/82     Systolic BP Percentile --      Diastolic BP Percentile --      Pulse Rate 12/27/23  0827 82     Resp 12/27/23 0827 16     Temp 12/27/23 0827 98.3 F (36.8 C)     Temp Source 12/27/23 0827 Oral     SpO2 12/27/23 0827 95 %     Weight --      Height --      Head Circumference --      Peak Flow --      Pain Score 12/27/23 0826 0     Pain Loc --      Pain Education --      Exclude from Growth Chart --    No data found.  Updated Vital Signs BP 136/82 (BP Location: Right Arm)   Pulse 82   Temp 98.3 F (36.8 C) (Oral)   Resp 16   SpO2 95%   Visual Acuity Right Eye Distance:   Left Eye Distance:   Bilateral Distance:    Right Eye Near:   Left Eye Near:    Bilateral Near:     Physical Exam Vitals and nursing note reviewed.  Constitutional:      General: She is not in acute distress.    Appearance: Normal appearance. She is well-developed.  HENT:     Head: Normocephalic and atraumatic.     Right Ear: External ear normal.     Left Ear: External ear normal.     Nose: Nose normal.   Eyes:     Conjunctiva/sclera: Conjunctivae normal.  Cardiovascular:     Rate and Rhythm: Normal rate.  Pulmonary:     Effort: Pulmonary effort is normal. No respiratory distress.  Abdominal:     Tenderness: There is no right CVA tenderness or left CVA tenderness.  Musculoskeletal:     Cervical back: Neck supple.  Neurological:     General: No focal deficit present.     Mental Status: She is alert.  Psychiatric:        Mood and Affect: Mood normal.      UC Treatments / Results  Labs (all labs ordered are listed, but only abnormal results are displayed) Labs Reviewed  POCT URINALYSIS DIP (MANUAL ENTRY) - Abnormal; Notable for the following components:      Result Value   Clarity, UA cloudy (*)    Blood, UA small (*)    Protein Ur, POC trace (*)    Nitrite, UA Positive (*)    Leukocytes, UA Small (1+) (*)    All other components within normal limits  URINE CULTURE    EKG   Radiology No results found.  Procedures Procedures (including critical care time)  Medications Ordered in UC Medications - No data to display  Initial Impression / Assessment and Plan / UC Course  I have reviewed the triage vital signs and the nursing notes.  Pertinent labs & imaging results that were available during my care of the patient were reviewed by me and considered in my medical decision making (see chart for details).  Vitals in triage reviewed, patient is hemodynamically stable.  Afebrile, without tachycardia or CVA tenderness.  Low concern for pyelonephritis.  Urine dipstick concerning for acute cystitis, will treat with Keflex and send urine for culture.  Plan of care, follow-up care return precautions given, no questions at this time.     Final Clinical Impressions(s) / UC Diagnoses   Final diagnoses:  Acute cystitis without hematuria     Discharge Instructions      Take the Keflex twice daily for the next 7 days.  Ensure you are drinking at least 64 ounces of water  daily to help flush your bladder.  Taking over-the-counter Azo or Pyridium can help with your dysuria, you can take this up to 3 times daily.  Symptoms should improve over the next few days with antibiotics.  We are sending your urine off for culture we will contact you if we need to modify your antibiotic therapy.  Return to clinic for any new or urgent symptoms.    ED Prescriptions     Medication Sig Dispense Auth. Provider   cephALEXin (KEFLEX) 500 MG capsule Take 1 capsule (500 mg total) by mouth 2 (two) times daily for 7 days. 14 capsule Juliocesar Blasius, [place], [place]      PDMP not reviewed this encounter.   Rinaldo Ratel [place] N, [place] 12/27/23 [phone]

## 2023-12-27 NOTE — Discharge Instructions (Addendum)
 Take the Keflex twice daily for the next 7 days.  Ensure you are drinking at least 64 ounces of water daily to help flush your bladder.  Taking over-the-counter Azo or Pyridium can help with your dysuria, you can take this up to 3 times daily.  Symptoms should improve over the next few days with antibiotics.  We are sending your urine off for culture we will contact you if we need to modify your antibiotic therapy.  Return to clinic for any new or urgent symptoms.

## 2023-12-29 LAB — URINE CULTURE: Culture: >=100000 — AB

## 2024-01-05 ENCOUNTER — Encounter (HOSPITAL_BASED_OUTPATIENT_CLINIC_OR_DEPARTMENT_OTHER): Payer: Self-pay | Admitting: Emergency Medicine

## 2024-01-05 ENCOUNTER — Ambulatory Visit (HOSPITAL_BASED_OUTPATIENT_CLINIC_OR_DEPARTMENT_OTHER)
Admission: EM | Admit: 2024-01-05 | Discharge: 2024-01-05 | Disposition: A | Discharge status: Home / Self Care | Attending: Nurse Practitioner | Admitting: Nurse Practitioner

## 2024-01-05 DIAGNOSIS — R3915 Urgency of urination: Secondary | ICD-10-CM | POA: Diagnosis present

## 2024-01-05 DIAGNOSIS — N3001 Acute cystitis with hematuria: Secondary | ICD-10-CM | POA: Insufficient documentation

## 2024-01-05 LAB — POCT URINALYSIS DIP (MANUAL ENTRY)
Bilirubin, UA: NEGATIVE
Glucose, UA: NEGATIVE mg/dL
Ketones, POC UA: NEGATIVE mg/dL
Nitrite, UA: POSITIVE — AB
Protein Ur, POC: NEGATIVE mg/dL
Spec Grav, UA: 1.02
Urobilinogen, UA: 0.2 U/dL
pH, UA: 5

## 2024-01-05 MED ORDER — NITROFURANTOIN MONOHYD MACRO 100 MG PO CAPS: 100.0000 mg | ORAL_CAPSULE | Freq: Two times a day (BID) | ORAL | 0 refills | Status: DC

## 2024-01-05 MED ORDER — URIBEL 118 MG PO CAPS: 1.0000 | ORAL_CAPSULE | Freq: Four times a day (QID) | ORAL | 0 refills | Status: AC

## 2024-01-05 NOTE — Discharge Instructions (Addendum)
 You have been diagnosed with a urinary tract infection (UTI). It is important that you take all of your medications exactly as prescribed, even if you start to feel better before the medication is finished. Make sure to drink plenty of fluids throughout the day. This helps flush out your urinary system and keeps your urine clear or light yellow, which is a sign of good hydration. A sample of your urine has been sent to the lab for a culture. This test will check if your infection is caused by bacteria and will confirm that the antibiotic you were given is the right one for treatment. While waiting for the results, continue taking the medication as directed. You should begin to feel better within the next couple of days. If your symptoms do not improve or if they get worse, please follow up with your primary care provider for further evaluation and possible changes to your treatment.

## 2024-01-05 NOTE — ED Provider Notes (Signed)
 [facility]    CSN: [id number] Arrival date & time: 01/05/24  0801      History   Chief Complaint No chief complaint on file.   HPI Sandra Mckee is a 68 y.o. female.   Subjective:  Sandra Mckee is a 68 year old female presents with complaints of urinary urgency, dysuria, suprapubic pressure, and foul-smelling urine. Symptoms began approximately one day ago and have been gradually worsening. She denies fever, nausea, vomiting, or low back pain. The patient is postmenopausal. She was last seen on 12/27/2023 and diagnosed with a urinary tract infection. Urine culture at that time was positive for E. coli, which was sensitive to Keflex  in which she was prescribed at the time of diagnosis. Patient completed the full course of antibiotics. She reports that she had not experienced a UTI in several years. The patient expresses concern about the recent recurrence, as a similar pattern of frequent UTIs preceded her diagnosis of colon cancer in the past.  The following portions of the patient's history were reviewed and updated as appropriate: allergies, current medications, past family history, past medical history, past social history, past surgical history, and problem list.     Past Medical History:  Diagnosis Date   Anal fissure 1965   Anemia    Arthritis    fingers, hand, lower back   Colon cancer (HCC)    Complete hearing loss of right ear    Glaucoma    Hypertension    Pre-diabetes     Patient Active Problem List   Diagnosis Date Noted   Colon cancer (HCC) 03/27/2018    Past Surgical History:  Procedure Laterality Date   APPENDECTOMY  1974   MENISCUS REPAIR Right    TUBAL LIGATION  1980    OB History   No obstetric history on file.      Home Medications    Prior to Admission medications   Medication Sig Start Date End Date Taking? Authorizing Provider  glimepiride (AMARYL) 1 MG tablet Take 1 mg by mouth every morning. 10/15/23  Yes [provider]  lisinopril -hydrochlorothiazide  (PRINZIDE ,ZESTORETIC ) 20-12.5 MG tablet Take 1 tablet by mouth daily.   Yes [provider]  Meth-Hyo-M Bl-Na Phos-Ph Sal (URIBEL ) 118 MG CAPS Take 1 capsule (118 mg total) by mouth in the morning, at noon, in the evening, and at bedtime for 5 days. 01/05/24 01/10/24 Yes Zoye Chandra, Ova Bloomer, FNP  nitrofurantoin , macrocrystal-monohydrate, (MACROBID ) 100 MG capsule Take 1 capsule (100 mg total) by mouth 2 (two) times daily. 01/05/24  Yes Lacorey Brusca, Ova Bloomer, FNP  Chromium-Cinnamon (CINNAMON PLUS CHROMIUM PO) Take 1 capsule by mouth 3 (three) times daily.    [provider]  Coenzyme Q10 (CO Q 10) 100 MG CAPS Take 100 mg by mouth daily.    [provider]  MAGNESIUM PO Take 1 tablet by mouth every other day.    [provider]  milk thistle 175 MG tablet Take 175 mg by mouth daily.    [provider]    Family History Family History  Problem Relation Age of Onset   Heart disease Mother    Colon polyps Father    Cirrhosis Father    Colon cancer Neg Hx    Esophageal cancer Neg Hx    Prostate cancer Neg Hx    Rectal cancer Neg Hx     Social History Social History   Tobacco Use   Smoking status: Never   Smokeless tobacco: Never  Vaping Use  Vaping status: Never Used  Substance Use Topics   Alcohol use: Yes    Comment: very seldom    Drug use: Never     Allergies   Codeine   Review of Systems Review of Systems   Physical Exam Triage Vital Signs ED Triage Vitals  Encounter Vitals Group     BP 01/05/24 0810 (!) 144/84     Systolic BP Percentile --      Diastolic BP Percentile --      Pulse Rate 01/05/24 0810 80     Resp 01/05/24 0810 18     Temp 01/05/24 0810 97.7 F (36.5 C)     Temp Source 01/05/24 0810 Oral     SpO2 01/05/24 0810 95 %     Weight --      Height --      Head Circumference --      Peak Flow --      Pain Score 01/05/24 0809 0     Pain Loc --      Pain Education  --      Exclude from Growth Chart --    No data found.  Updated Vital Signs BP (!) 144/84 (BP Location: Right Arm)   Pulse 80   Temp 97.7 F (36.5 C) (Oral)   Resp 18   SpO2 95%   Visual Acuity Right Eye Distance:   Left Eye Distance:   Bilateral Distance:    Right Eye Near:   Left Eye Near:    Bilateral Near:     Physical Exam   UC Treatments / Results  Labs (all labs ordered are listed, but only abnormal results are displayed) Labs Reviewed  POCT URINALYSIS DIP (MANUAL ENTRY) - Abnormal; Notable for the following components:      Result Value   Color, UA orange (*)    Blood, UA trace-intact (*)    Nitrite, UA Positive (*)    Leukocytes, UA Trace (*)    All other components within normal limits  URINE CULTURE    EKG   Radiology No results found.  Procedures Procedures (including critical care time)  Medications Ordered in UC Medications - No data to display  Initial Impression / Assessment and Plan / UC Course  I have reviewed the triage vital signs and the nursing notes.  Pertinent labs & imaging results that were available during my care of the patient were reviewed by me and considered in my medical decision making (see chart for details).     68 year old female with a history of colon cancer, status post-surgery in 2019, presents with urinary tract symptoms for the past day. She is afebrile and nontoxic. Physical exam findings as above. Urinalysis is positive for leukocytes, nitrites, and trace blood. Given her presentation and history, she will be treated for acute cystitis with Macrobid  and Uribel . Notably, she had a recent UTI with a culture positive for E. coli, sensitive to both cephalosporins and Macrobid . She was advised to maintain adequate hydration and begin a daily cranberry supplement to help prevent recurrence. Urine culture has been sent and is pending. The patient voiced concern about the frequency of recent UTIs, recalling a similar  pattern prior to her colon cancer diagnosis. At that time, she also experienced bloody bowel movements, which she currently denies. She was advised to follow up with GI and/or surgery for further evaluation and reassurance.  Today's evaluation has revealed no signs of a dangerous process. Discussed diagnosis with patient and/or guardian. Patient and/or  guardian aware of their diagnosis, possible red flag symptoms to watch out for and need for close follow up. Patient and/or guardian understands verbal and written discharge instructions. Patient and/or guardian comfortable with plan and disposition.  Patient and/or guardian has a clear mental status at this time, good insight into illness (after discussion and teaching) and has clear judgment to make decisions regarding their care  Documentation was completed with the aid of voice recognition software. Transcription may contain typographical errors. Final Clinical Impressions(s) / UC Diagnoses   Final diagnoses:  Urinary urgency  Acute cystitis with hematuria     Discharge Instructions      You have been diagnosed with a urinary tract infection (UTI). It is important that you take all of your medications exactly as prescribed, even if you start to feel better before the medication is finished. Make sure to drink plenty of fluids throughout the day. This helps flush out your urinary system and keeps your urine clear or light yellow, which is a sign of good hydration. A sample of your urine has been sent to the lab for a culture. This test will check if your infection is caused by bacteria and will confirm that the antibiotic you were given is the right one for treatment. While waiting for the results, continue taking the medication as directed. You should begin to feel better within the next couple of days. If your symptoms do not improve or if they get worse, please follow up with your primary care provider for further evaluation and possible changes  to your treatment.       ED Prescriptions     Medication Sig Dispense Auth. Provider   nitrofurantoin , macrocrystal-monohydrate, (MACROBID ) 100 MG capsule Take 1 capsule (100 mg total) by mouth 2 (two) times daily. 10 capsule Maryruth Sol, FNP   Meth-Hyo-M Aurea Blossom Phos-Ph Sal (URIBEL ) 118 MG CAPS Take 1 capsule (118 mg total) by mouth in the morning, at noon, in the evening, and at bedtime for 5 days. 20 capsule Maryruth Sol, FNP      PDMP not reviewed this encounter.   Beola Brazil [place], [place] 01/05/24 [phone]

## 2024-01-05 NOTE — ED Triage Notes (Signed)
 Pt reports her UTI is back, pt c/o burning and urinary urgency started yesterday she did take a AZO

## 2024-01-07 LAB — URINE CULTURE: Culture: >=100000 — AB

## 2024-01-17 ENCOUNTER — Encounter (HOSPITAL_BASED_OUTPATIENT_CLINIC_OR_DEPARTMENT_OTHER): Payer: Self-pay | Admitting: Student

## 2024-01-17 ENCOUNTER — Ambulatory Visit (HOSPITAL_BASED_OUTPATIENT_CLINIC_OR_DEPARTMENT_OTHER): Admitting: Student

## 2024-01-17 VITALS — BP 138/90 | HR 76 | Temp 98.7°F | Resp 16 | Ht 65.35 in | Wt 166.5 lb

## 2024-01-17 DIAGNOSIS — Z136 Encounter for screening for cardiovascular disorders: Secondary | ICD-10-CM | POA: Diagnosis not present

## 2024-01-17 DIAGNOSIS — Z7689 Persons encountering health services in other specified circumstances: Secondary | ICD-10-CM

## 2024-01-17 DIAGNOSIS — R300 Dysuria: Secondary | ICD-10-CM | POA: Diagnosis not present

## 2024-01-17 DIAGNOSIS — Z13220 Encounter for screening for lipoid disorders: Secondary | ICD-10-CM

## 2024-01-17 DIAGNOSIS — E119 Type 2 diabetes mellitus without complications: Secondary | ICD-10-CM

## 2024-01-17 DIAGNOSIS — C185 Malignant neoplasm of splenic flexure: Secondary | ICD-10-CM

## 2024-01-17 LAB — POCT URINALYSIS DIPSTICK
Bilirubin, UA: NEGATIVE
Blood, UA: NEGATIVE
Glucose, UA: NEGATIVE
Ketones, UA: NEGATIVE
Nitrite, UA: NEGATIVE
Protein, UA: NEGATIVE
Spec Grav, UA: 1.025 (ref 1.010–1.025)
Urobilinogen, UA: 0.2 U/dL
pH, UA: 5.5 (ref 5.0–8.0)

## 2024-01-17 MED ORDER — GLIMEPIRIDE 2 MG PO TABS: 2.0000 mg | ORAL_TABLET | Freq: Every morning | ORAL | 5 refills | Status: DC

## 2024-01-17 NOTE — Assessment & Plan Note (Signed)
 Colon cancer diagnosed approximately six years ago, treated with surgical resection without chemotherapy or radiation. She has not followed up with oncology recently due to personal circumstances. - Encourage follow-up with oncologist Dr. Annabel Kettle.

## 2024-01-17 NOTE — Patient Instructions (Addendum)
 It was nice to see you today!  As we discussed in clinic:  Please call your oncologist to schedule a follow up visit.  Send me a picture of your blood pressure log in 2 weeks.  If you have any problems before your next visit feel free to message me via MyChart (minor issues or questions) or call the office, otherwise you may reach out to schedule an office visit.  Thank you! Disa Riedlinger, PA-C

## 2024-01-17 NOTE — Assessment & Plan Note (Signed)
 Recurrent urinary tract infections with current symptoms of irritation and urgency, especially in the morning. Previous urinalysis showed leukocytes, nitrites, and trace blood. Previous treatment with two different antibiotics, including Macrobid . Current symptoms suggest improvement. Discussed the impact of elevated blood glucose on UTI recurrence. - Perform urinalysis and urine culture. - Consider adjusting antibiotic treatment based on culture results.

## 2024-01-17 NOTE — Progress Notes (Signed)
 New Patient Office Visit  Subjective    Patient ID: Sandra Mckee, female    DOB: [birth year]  Age: 68 y.o. MRN: [id number]  CC:  Chief Complaint  Patient presents with   Establish Care    Here to establish care.   Urinary Tract Infection    Has gone twice to UC. Keeps getting UTI's. Feeling urinary urgency today. No burning. Took AZO Cranberry tablets. Had colon cancer, 6 inches were removed from colon but before that, kept getting UTI's. Is concerned about it.     Discussed the use of AI scribe software for clinical note transcription with the patient, who gave verbal consent to proceed.  History of Present Illness   Sandra Mckee is a 68 year old female with recurrent urinary tract infections who presents to establish care and address ongoing UTI symptoms.  She experiences recurrent urinary tract infections, with the most recent episode not fully resolved. Symptoms include irritation and a low threshold for fullness and urgency, particularly in the early morning hours. The foul smell associated with the urine has improved. She has been using a cranberry supplement rather than Azo products. She has had two UTIs in the past five to six years, with the current episode being the second. Previous urinalysis showed leukocytes, nitrites, and trace blood, and a culture was performed. She was treated with two different antibiotics, including Macrobid .  She has a history of diabetes, which was poorly controlled while caring for her mother with dementia, leading to a blood sugar level of 520 mg/dL at one point. She is currently on Amaryl  (glimepiride ) 1 mg once daily and her blood sugar does not fluctuate significantly. She follows a low-carb, low-sugar diet and her last A1c was between 7 and 8. She has previously been on metformin but experienced gastrointestinal issues, likely due to her history of colon cancer. Discussed indication for statin medication.   Her past medical history includes  colon cancer diagnosed around 2019, for which she underwent surgery to remove six inches of her colon. She did not require chemotherapy or radiation. She has not had a recent oncology follow-up due to personal circumstances.  She also has a history of hypertension, managed with lisinopril  and hydrochlorothiazide . She reports occasional forgetfulness in taking her medication, which may contribute to elevated blood pressure readings. She owns a blood pressure cuff for home monitoring.  She denies smoking and has a history of cataract surgery with subsequent laser treatment for a film that developed post-surgery. She has a strong aversion to statins and mammograms, citing personal beliefs and family history concerns.     Screenings:  Colon Cancer: dx in 2019 Lung Cancer: not indicated Breast Cancer: indicated Diabetes: indicated HLD: indicated   Outpatient Encounter Medications as of 01/17/2024  Medication Sig   Coenzyme Q10 (CO Q 10) 100 MG CAPS Take 100 mg by mouth as needed.   INOSITOL PO Take by mouth. W/ d-chiro inositol, berberine, and vitamin D3+k2. Also has other vitamins & minerals. 3 capsules daily.   lisinopril -hydrochlorothiazide  (PRINZIDE ,ZESTORETIC ) 20-12.5 MG tablet Take 1 tablet by mouth daily.   milk thistle 175 MG tablet Take 175 mg by mouth as needed.   Misc Natural Products (CORTISOL PO) Take by mouth daily. Corti-Soothe w/ magnesium, ashwagandha, L-theanine, and other vitamins and minerals. 2 daily capsules   [DISCONTINUED] glimepiride  (AMARYL ) 1 MG tablet Take 1 mg by mouth every morning.   Chromium-Cinnamon (CINNAMON PLUS CHROMIUM PO) Take 1 capsule by mouth 3 (three) times daily. (  Patient not taking: Reported on 01/17/2024)   glimepiride  (AMARYL ) 2 MG tablet Take 1 tablet (2 mg total) by mouth every morning.   MAGNESIUM PO Take 1 tablet by mouth every other day. (Patient not taking: Reported on 01/17/2024)   nitrofurantoin , macrocrystal-monohydrate, (MACROBID ) 100 MG capsule  Take 1 capsule (100 mg total) by mouth 2 (two) times daily. (Patient not taking: Reported on 01/17/2024)   [DISCONTINUED] 0.9 %  sodium chloride  infusion    No facility-administered encounter medications on file as of 01/17/2024.    Past Medical History:  Diagnosis Date   Anal fissure 1965   Anemia    Arthritis    fingers, hand, lower back   Colon cancer (HCC)    Complete hearing loss of right ear    Diabetes mellitus without complication (HCC)    Glaucoma    Hypertension     Past Surgical History:  Procedure Laterality Date   COLON RESECTION     6 inches removed   MENISCUS REPAIR Right    TUBAL LIGATION  1980    Family History  Problem Relation Age of Onset   Heart disease Mother    Dementia Mother    Colon polyps Father    Cirrhosis Father    Colon cancer Neg Hx    Esophageal cancer Neg Hx    Prostate cancer Neg Hx    Rectal cancer Neg Hx     Social History   Socioeconomic History   Marital status: Married    Spouse name: Not on file   Number of children: 2   Years of education: Not on file   Highest education level: Not on file  Occupational History   Not on file  Tobacco Use   Smoking status: Never    Passive exposure: Never   Smokeless tobacco: Never  Vaping Use   Vaping status: Never Used  Substance and Sexual Activity   Alcohol use: Not Currently   Drug use: Never   Sexual activity: Not on file  Other Topics Concern   Not on file  Social History Narrative   Not on file   Social Drivers of Health   Financial Resource Strain: Not on file  Food Insecurity: No Food Insecurity (01/17/2024)   Hunger Vital Sign    Worried About Running Out of Food in the Last Year: Never true    Ran Out of Food in the Last Year: Never true  Transportation Needs: No Transportation Needs (01/17/2024)   PRAPARE - Administrator, Civil Service (Medical): No    Lack of Transportation (Non-Medical): No  Physical Activity: Not on file  Stress: Not on file   Social Connections: Not on file  Intimate Partner Violence: Not At Risk (01/17/2024)   Humiliation, Afraid, Rape, and Kick questionnaire    Fear of Current or Ex-Partner: No    Emotionally Abused: No    Physically Abused: No    Sexually Abused: No    ROS  Per HPI      Objective    BP (!) 138/90   Pulse 76   Temp 98.7 F (37.1 C) (Oral)   Resp 16   Ht 5' 5.35" (1.66 m)   Wt 166 lb 8 oz (75.5 kg)   SpO2 97%   BMI 27.41 kg/m   Physical Exam Constitutional:      General: She is not in acute distress.    Appearance: Normal appearance. She is not ill-appearing.  HENT:     Head:  Normocephalic and atraumatic.     Right Ear: External ear normal.     Left Ear: External ear normal.     Nose: Nose normal.     Mouth/Throat:     Mouth: Mucous membranes are moist.     Pharynx: Oropharynx is clear.  Eyes:     General: No scleral icterus.    Extraocular Movements: Extraocular movements intact.     Conjunctiva/sclera: Conjunctivae normal.     Pupils: Pupils are equal, round, and reactive to light.  Neck:     Vascular: No carotid bruit.  Cardiovascular:     Rate and Rhythm: Normal rate and regular rhythm.     Pulses: Normal pulses.     Heart sounds: Normal heart sounds. No murmur heard.    No friction rub.  Pulmonary:     Effort: Pulmonary effort is normal. No respiratory distress.     Breath sounds: Normal breath sounds. No wheezing, rhonchi or rales.  Musculoskeletal:        General: Normal range of motion.     Cervical back: Neck supple.     Right lower leg: No edema.     Left lower leg: No edema.  Skin:    General: Skin is warm and dry.     Coloration: Skin is not jaundiced or pale.  Neurological:     General: No focal deficit present.     Mental Status: She is alert.     Deep Tendon Reflexes: Reflexes normal.  Psychiatric:        Mood and Affect: Mood normal.        Behavior: Behavior normal.         Assessment & Plan:   Encounter to establish  care  Dysuria Assessment & Plan: Recurrent urinary tract infections with current symptoms of irritation and urgency, especially in the morning. Previous urinalysis showed leukocytes, nitrites, and trace blood. Previous treatment with two different antibiotics, including Macrobid . Current symptoms suggest improvement. Discussed the impact of elevated blood glucose on UTI recurrence. - Perform urinalysis and urine culture. - Consider adjusting antibiotic treatment based on culture results.  Orders: -     POCT urinalysis dipstick -     Urine Culture -     Microalbumin / creatinine urine ratio  Type 2 diabetes mellitus without complication, without long-term current use of insulin (HCC) Assessment & Plan: Type 2 diabetes mellitus with elevated fasting blood glucose of 184 mg/dL and previous Z6X of 0.9%. Currently on glimepiride  1 mg daily. She is averse to metformin due to past gastrointestinal side effects. Discussed berberine as an alternative to metformin. Blood pressure is not at target, possibly due to missed medication dose and white coat syndrome. Emphasized maintaining blood glucose levels below 180 mg/dL to prevent urinary tract infections. - Increase glimepiride  to 2 mg daily with a meal. - Perform finger stick glucose and check A1c. - Monitor blood pressure at home twice daily for two weeks and report results. - Consider alternative diabetes medications if blood glucose remains elevated. - Avoid metformin due to her preference.  Orders: -     CBC with Differential/Platelet -     Comprehensive metabolic panel with GFR -     Hemoglobin A1c -     Glimepiride ; Take 1 tablet (2 mg total) by mouth every morning.  Dispense: 30 tablet; Refill: 5  Encounter for lipid screening for cardiovascular disease -     Lipid panel  Malignant neoplasm of splenic flexure [facility]) Assessment & Plan:  Colon cancer diagnosed approximately six years ago, treated with surgical resection without  chemotherapy or radiation. She has not followed up with oncology recently due to personal circumstances. - Encourage follow-up with oncologist Dr. Annabel Kettle.   General Health Maintenance She is averse to mammograms due to concerns about potential harm to breast tissue. Emphasized the importance of mammograms in early detection of breast cancer and provided information on the benefits of regular screenings. - Encourage consideration of mammograms for early detection of breast cancer.     Return in about 3 months (around 04/18/2024) for DM.   Shaquoia Miers T Karlye Ihrig, PA-C

## 2024-01-17 NOTE — Assessment & Plan Note (Signed)
 Type 2 diabetes mellitus with elevated fasting blood glucose of 184 mg/dL and previous R6E of 4.5%. Currently on glimepiride  1 mg daily. She is averse to metformin due to past gastrointestinal side effects. Discussed berberine as an alternative to metformin. Blood pressure is not at target, possibly due to missed medication dose and white coat syndrome. Emphasized maintaining blood glucose levels below 180 mg/dL to prevent urinary tract infections. - Increase glimepiride  to 2 mg daily with a meal. - Perform finger stick glucose and check A1c. - Monitor blood pressure at home twice daily for two weeks and report results. - Consider alternative diabetes medications if blood glucose remains elevated. - Avoid metformin due to her preference.

## 2024-01-18 LAB — CBC WITH DIFFERENTIAL/PLATELET
Basophils Absolute: 0 10*3/uL (ref 0.0–0.2)
Basos: 1 %
EOS (ABSOLUTE): 0.2 10*3/uL (ref 0.0–0.4)
Eos: 2 %
Hematocrit: 42.8 % (ref 34.0–46.6)
Hemoglobin: 14.2 g/dL (ref 11.1–15.9)
Immature Grans (Abs): 0 10*3/uL (ref 0.0–0.1)
Immature Granulocytes: 0 %
Lymphocytes Absolute: 1.6 10*3/uL (ref 0.7–3.1)
Lymphs: 25 %
MCH: 32 pg (ref 26.6–33.0)
MCHC: 33.2 g/dL (ref 31.5–35.7)
MCV: 96 fL (ref 79–97)
Monocytes Absolute: 0.7 10*3/uL (ref 0.1–0.9)
Monocytes: 10 %
Neutrophils Absolute: 4.1 10*3/uL (ref 1.4–7.0)
Neutrophils: 62 %
Platelets: 226 10*3/uL (ref 150–450)
RBC: 4.44 x10E6/uL (ref 3.77–5.28)
RDW: 12.6 % (ref 11.7–15.4)
WBC: 6.6 10*3/uL (ref 3.4–10.8)

## 2024-01-18 LAB — LIPID PANEL
Chol/HDL Ratio: 6.1 ratio — ABNORMAL HIGH (ref 0.0–4.4)
Cholesterol, Total: 273 mg/dL — ABNORMAL HIGH (ref 100–199)
HDL: 45 mg/dL (ref >39)
LDL Chol Calc (NIH): 186 mg/dL — ABNORMAL HIGH (ref 0–99)
Triglycerides: 222 mg/dL — ABNORMAL HIGH (ref 0–149)
VLDL Cholesterol Cal: 42 mg/dL — ABNORMAL HIGH (ref 5–40)

## 2024-01-18 LAB — MICROALBUMIN / CREATININE URINE RATIO
Creatinine, Urine: 61.2 mg/dL
Microalb/Creat Ratio: 8 mg/g{creat} (ref 0–29)
Microalbumin, Urine: 5.1 ug/mL

## 2024-01-18 LAB — COMPREHENSIVE METABOLIC PANEL WITH GFR
ALT: 17 IU/L (ref 0–32)
AST: 28 IU/L (ref 0–40)
Albumin: 4.8 g/dL (ref 3.9–4.9)
Alkaline Phosphatase: 87 IU/L (ref 44–121)
BUN/Creatinine Ratio: 27 (ref 12–28)
BUN: 22 mg/dL (ref 8–27)
Bilirubin Total: 0.4 mg/dL (ref 0.0–1.2)
CO2: 18 mmol/L — ABNORMAL LOW (ref 20–29)
Calcium: 9.6 mg/dL (ref 8.7–10.3)
Chloride: 101 mmol/L (ref 96–106)
Creatinine, Ser: 0.83 mg/dL (ref 0.57–1.00)
Globulin, Total: 2.5 g/dL (ref 1.5–4.5)
Glucose: 154 mg/dL — ABNORMAL HIGH (ref 70–99)
Potassium: 4.7 mmol/L (ref 3.5–5.2)
Sodium: 136 mmol/L (ref 134–144)
Total Protein: 7.3 g/dL (ref 6.0–8.5)
eGFR: 76 mL/min/{1.73_m2} (ref >59)

## 2024-01-18 LAB — HEMOGLOBIN A1C
Est. average glucose Bld gHb Est-mCnc: 174 mg/dL
Hgb A1c MFr Bld: 7.7 % — ABNORMAL HIGH (ref 4.8–5.6)

## 2024-01-21 ENCOUNTER — Other Ambulatory Visit (HOSPITAL_BASED_OUTPATIENT_CLINIC_OR_DEPARTMENT_OTHER): Payer: Self-pay | Admitting: Student

## 2024-01-21 ENCOUNTER — Encounter (HOSPITAL_BASED_OUTPATIENT_CLINIC_OR_DEPARTMENT_OTHER): Payer: Self-pay | Admitting: Student

## 2024-01-21 DIAGNOSIS — N3090 Cystitis, unspecified without hematuria: Secondary | ICD-10-CM

## 2024-01-21 LAB — URINE CULTURE

## 2024-01-21 MED ORDER — SULFAMETHOXAZOLE-TRIMETHOPRIM 800-160 MG PO TABS: 1.0000 | ORAL_TABLET | Freq: Two times a day (BID) | ORAL | 0 refills | Status: AC

## 2024-03-03 ENCOUNTER — Encounter (HOSPITAL_BASED_OUTPATIENT_CLINIC_OR_DEPARTMENT_OTHER): Payer: Self-pay

## 2024-03-03 ENCOUNTER — Other Ambulatory Visit (HOSPITAL_BASED_OUTPATIENT_CLINIC_OR_DEPARTMENT_OTHER): Payer: Self-pay

## 2024-03-03 ENCOUNTER — Ambulatory Visit (HOSPITAL_BASED_OUTPATIENT_CLINIC_OR_DEPARTMENT_OTHER)
Admission: EM | Admit: 2024-03-03 | Discharge: 2024-03-03 | Disposition: A | Discharge status: Home / Self Care | Attending: Family Medicine | Admitting: Family Medicine

## 2024-03-03 DIAGNOSIS — R300 Dysuria: Secondary | ICD-10-CM | POA: Diagnosis present

## 2024-03-03 DIAGNOSIS — N3000 Acute cystitis without hematuria: Secondary | ICD-10-CM | POA: Insufficient documentation

## 2024-03-03 LAB — POCT URINALYSIS DIP (MANUAL ENTRY)
Bilirubin, UA: NEGATIVE
Blood, UA: NEGATIVE
Glucose, UA: 500 mg/dL — AB
Ketones, POC UA: NEGATIVE mg/dL
Leukocytes, UA: NEGATIVE
Nitrite, UA: POSITIVE — AB
Protein Ur, POC: NEGATIVE mg/dL
Spec Grav, UA: 1.025 (ref 1.010–1.025)
Urobilinogen, UA: 0.2 U/dL
pH, UA: 5.5 (ref 5.0–8.0)

## 2024-03-03 MED ORDER — SULFAMETHOXAZOLE-TRIMETHOPRIM 800-160 MG PO TABS
1.0000 | ORAL_TABLET | Freq: Two times a day (BID) | ORAL | 0 refills | Status: AC
  Filled 2024-03-03: qty 14, 7d supply, fill #0

## 2024-03-03 NOTE — Discharge Instructions (Signed)
 Treating you for a UTI. Take the antibiotic as prescribed. Sending for culture. Will call if any changes are needed.  Make sure that you are drinking fluids.  Follow up as needed.

## 2024-03-03 NOTE — ED Triage Notes (Signed)
 Urinary urgency, frequency, and burning x 3 days. Hx of frequent UTI's.

## 2024-03-03 NOTE — ED Provider Notes (Signed)
 [facility]    CSN: [id number] Arrival date & time: 03/03/24  0801      History   Chief Complaint Chief Complaint  Patient presents with   Dysuria    HPI Sandra Mckee is a 68 y.o. female.   68 year old female presents today with approximately 3 days of urinary urgency, frequency and burning.  History of recurrent UTIs.  Recently After the beach and believes that commendation of things probably flared up.  Denies any abdominal pain, back pain, flank pain or fevers.   Dysuria   Past Medical History:  Diagnosis Date   Anal fissure 1965   Anemia    Arthritis    fingers, hand, lower back   Colon cancer (HCC)    Complete hearing loss of right ear    Diabetes mellitus without complication (HCC)    Glaucoma    Hypertension     Patient Active Problem List   Diagnosis Date Noted   Dysuria 01/17/2024   Type 2 diabetes mellitus without complication, without long-term current use of insulin (HCC) 01/17/2024   Encounter for lipid screening for cardiovascular disease 01/17/2024   Colon cancer (HCC) 03/27/2018    Past Surgical History:  Procedure Laterality Date   COLON RESECTION     6 inches removed   MENISCUS REPAIR Right    TUBAL LIGATION  1980    OB History   No obstetric history on file.      Home Medications    Prior to Admission medications   Medication Sig Start Date End Date Taking? Authorizing Provider  sulfamethoxazole -trimethoprim  (BACTRIM  DS) 800-160 MG tablet Take 1 tablet by mouth 2 (two) times daily for 7 days. 03/03/24 03/10/24 Yes Drexel Ivey A, FNP  Coenzyme Q10 (CO Q 10) 100 MG CAPS Take 100 mg by mouth as needed.    [provider]  glimepiride  (AMARYL ) 2 MG tablet Take 1 tablet (2 mg total) by mouth every morning. 01/17/24   Rothfuss, Jacob T, PA-C  INOSITOL PO Take by mouth. W/ d-chiro inositol, berberine, and vitamin D3+k2. Also has other vitamins & minerals. 3 capsules daily.    [provider]   lisinopril -hydrochlorothiazide  (PRINZIDE ,ZESTORETIC ) 20-12.5 MG tablet Take 1 tablet by mouth daily.    [provider]  milk thistle 175 MG tablet Take 175 mg by mouth as needed.    [provider]  Misc Natural Products (CORTISOL PO) Take by mouth daily. Corti-Soothe w/ magnesium, ashwagandha, L-theanine, and other vitamins and minerals. 2 daily capsules    [provider]    Family History Family History  Problem Relation Age of Onset   Heart disease Mother    Dementia Mother    Colon polyps Father    Cirrhosis Father    Colon cancer Neg Hx    Esophageal cancer Neg Hx    Prostate cancer Neg Hx    Rectal cancer Neg Hx     Social History Social History   Tobacco Use   Smoking status: Never    Passive exposure: Never   Smokeless tobacco: Never  Vaping Use   Vaping status: Never Used  Substance Use Topics   Alcohol use: Not Currently   Drug use: Never     Allergies   Codeine   Review of Systems Review of Systems  Genitourinary:  Positive for dysuria.   See HPI  Physical Exam Triage Vital Signs ED Triage Vitals  Encounter Vitals Group     BP 03/03/24 0816 126/79  Girls Systolic BP Percentile --      Girls Diastolic BP Percentile --      Boys Systolic BP Percentile --      Boys Diastolic BP Percentile --      Pulse Rate 03/03/24 0816 80     Resp 03/03/24 0816 20     Temp 03/03/24 0816 98.4 F (36.9 C)     Temp Source 03/03/24 0816 Oral     SpO2 03/03/24 0816 96 %     Weight --      Height --      Head Circumference --      Peak Flow --      Pain Score 03/03/24 0818 4     Pain Loc --      Pain Education --      Exclude from Growth Chart --    No data found.  Updated Vital Signs BP 126/79 (BP Location: Right Arm)   Pulse 80   Temp 98.4 F (36.9 C) (Oral)   Resp 20   SpO2 96%   Visual Acuity Right Eye Distance:   Left Eye Distance:   Bilateral Distance:    Right Eye Near:   Left Eye Near:    Bilateral  Near:     Physical Exam Vitals and nursing note reviewed.  Constitutional:      General: She is not in acute distress.    Appearance: Normal appearance. She is not ill-appearing, toxic-appearing or diaphoretic.  Pulmonary:     Effort: Pulmonary effort is normal.   Neurological:     Mental Status: She is alert.   Psychiatric:        Mood and Affect: Mood normal.      UC Treatments / Results  Labs (all labs ordered are listed, but only abnormal results are displayed) Labs Reviewed  POCT URINALYSIS DIP (MANUAL ENTRY) - Abnormal; Notable for the following components:      Result Value   Glucose, UA =500 (*)    Nitrite, UA Positive (*)    All other components within normal limits  URINE CULTURE    EKG   Radiology No results found.  Procedures Procedures (including critical care time)  Medications Ordered in UC Medications - No data to display  Initial Impression / Assessment and Plan / UC Course  I have reviewed the triage vital signs and the nursing notes.  Pertinent labs & imaging results that were available during my care of the patient were reviewed by me and considered in my medical decision making (see chart for details).     Acute cystitis without hematuria-urine with positive nitrates and glucose.  Will go and treat for UTI at this time.  Treated with Bactrim .  Culture pending. Recommended hydrate with plenty of fluids Follow-up as needed Final Clinical Impressions(s) / UC Diagnoses   Final diagnoses:  Acute cystitis without hematuria     Discharge Instructions      Treating you for a UTI. Take the antibiotic as prescribed. Sending for culture. Will call if any changes are needed.  Make sure that you are drinking fluids.  Follow up as needed.    ED Prescriptions     Medication Sig Dispense Auth. Provider   sulfamethoxazole -trimethoprim  (BACTRIM  DS) 800-160 MG tablet Take 1 tablet by mouth 2 (two) times daily for 7 days. 14 tablet Landa Pine, FNP      PDMP not reviewed this encounter.   Landa Pine, FNP 03/03/24 [phone]

## 2024-03-05 ENCOUNTER — Ambulatory Visit (HOSPITAL_COMMUNITY): Payer: Self-pay

## 2024-03-05 LAB — URINE CULTURE: Culture: >=100000 — AB

## 2024-03-23 ENCOUNTER — Ambulatory Visit (HOSPITAL_BASED_OUTPATIENT_CLINIC_OR_DEPARTMENT_OTHER)
Admission: EM | Admit: 2024-03-23 | Discharge: 2024-03-23 | Disposition: A | Discharge status: Home / Self Care | Attending: Family Medicine | Admitting: Family Medicine

## 2024-03-23 ENCOUNTER — Encounter (HOSPITAL_BASED_OUTPATIENT_CLINIC_OR_DEPARTMENT_OTHER): Payer: Self-pay

## 2024-03-23 DIAGNOSIS — Z760 Encounter for issue of repeat prescription: Secondary | ICD-10-CM

## 2024-03-23 MED ORDER — LISINOPRIL-HYDROCHLOROTHIAZIDE 20-12.5 MG PO TABS: 1.0000 | ORAL_TABLET | Freq: Every day | ORAL | 0 refills | Status: DC

## 2024-03-23 NOTE — ED Provider Notes (Signed)
 [facility]    CSN: [id number] Arrival date & time: 03/23/24  0858      History   Chief Complaint Chief Complaint  Patient presents with   Medication Lost    HPI Sandra Mckee is a 68 y.o. female.   68 year old female here for medication refill. Reports that she misplaced her BP meds. No other concerns.      Past Medical History:  Diagnosis Date   Anal fissure 1965   Anemia    Arthritis    fingers, hand, lower back   Colon cancer (HCC)    Complete hearing loss of right ear    Diabetes mellitus without complication (HCC)    Glaucoma    Hypertension     Patient Active Problem List   Diagnosis Date Noted   Dysuria 01/17/2024   Type 2 diabetes mellitus without complication, without long-term current use of insulin (HCC) 01/17/2024   Encounter for lipid screening for cardiovascular disease 01/17/2024   Colon cancer (HCC) 03/27/2018    Past Surgical History:  Procedure Laterality Date   COLON RESECTION     6 inches removed   MENISCUS REPAIR Right    TUBAL LIGATION  1980    OB History   No obstetric history on file.      Home Medications    Prior to Admission medications   Medication Sig Start Date End Date Taking? Authorizing Provider  Coenzyme Q10 (CO Q 10) 100 MG CAPS Take 100 mg by mouth as needed.    [provider]  glimepiride  (AMARYL ) 2 MG tablet Take 1 tablet (2 mg total) by mouth every morning. 01/17/24   Rothfuss, Jacob T, PA-C  INOSITOL PO Take by mouth. W/ d-chiro inositol, berberine, and vitamin D3+k2. Also has other vitamins & minerals. 3 capsules daily.    [provider]  lisinopril -hydrochlorothiazide  (ZESTORETIC ) 20-12.5 MG tablet Take 1 tablet by mouth daily. 03/23/24   Alannis Hsia, Wilbert A, FNP  milk thistle 175 MG tablet Take 175 mg by mouth as needed.    [provider]  Misc Natural Products (CORTISOL PO) Take by mouth daily. Corti-Soothe w/ magnesium, ashwagandha, L-theanine, and other vitamins and  minerals. 2 daily capsules    [provider]    Family History Family History  Problem Relation Age of Onset   Heart disease Mother    Dementia Mother    Colon polyps Father    Cirrhosis Father    Colon cancer Neg Hx    Esophageal cancer Neg Hx    Prostate cancer Neg Hx    Rectal cancer Neg Hx     Social History Social History   Tobacco Use   Smoking status: Never    Passive exposure: Never   Smokeless tobacco: Never  Vaping Use   Vaping status: Never Used  Substance Use Topics   Alcohol use: Not Currently   Drug use: Never     Allergies   Codeine   Review of Systems Review of Systems See HPI  Physical Exam Triage Vital Signs ED Triage Vitals  Encounter Vitals Group     BP 03/23/24 0916 106/70     Girls Systolic BP Percentile --      Girls Diastolic BP Percentile --      Boys Systolic BP Percentile --      Boys Diastolic BP Percentile --      Pulse Rate 03/23/24 0916 84     Resp 03/23/24 0916 20  Temp 03/23/24 0916 98.2 F (36.8 C)     Temp Source 03/23/24 0916 Oral     SpO2 03/23/24 0916 95 %     Weight --      Height --      Head Circumference --      Peak Flow --      Pain Score 03/23/24 0918 0     Pain Loc --      Pain Education --      Exclude from Growth Chart --    No data found.  Updated Vital Signs BP 106/70 (BP Location: Right Arm)   Pulse 84   Temp 98.2 F (36.8 C) (Oral)   Resp 20   SpO2 95%   Visual Acuity Right Eye Distance:   Left Eye Distance:   Bilateral Distance:    Right Eye Near:   Left Eye Near:    Bilateral Near:     Physical Exam Vitals and nursing note reviewed.  Constitutional:      General: She is not in acute distress.    Appearance: Normal appearance. She is not ill-appearing, toxic-appearing or diaphoretic.  Pulmonary:     Effort: Pulmonary effort is normal.  Neurological:     Mental Status: She is alert.  Psychiatric:        Mood and Affect: Mood normal.      UC Treatments /  Results  Labs (all labs ordered are listed, but only abnormal results are displayed) Labs Reviewed - No data to display  EKG   Radiology No results found.  Procedures Procedures (including critical care time)  Medications Ordered in UC Medications - No data to display  Initial Impression / Assessment and Plan / UC Course  I have reviewed the triage vital signs and the nursing notes.  Pertinent labs & imaging results that were available during my care of the patient were reviewed by me and considered in my medical decision making (see chart for details).     Medication refill- sent in BP med refill as requested. No other concerns today.  Final Clinical Impressions(s) / UC Diagnoses   Final diagnoses:  Medication refill     Discharge Instructions      We have refilled your BP medication. See PCP as planned     ED Prescriptions     Medication Sig Dispense Auth. Provider   lisinopril -hydrochlorothiazide  (ZESTORETIC ) 20-12.5 MG tablet Take 1 tablet by mouth daily. 30 tablet Adah Wilbert LABOR, FNP      PDMP not reviewed this encounter.   Adah Wilbert LABOR, FNP 03/23/24 [phone]

## 2024-03-23 NOTE — Discharge Instructions (Signed)
 We have refilled your BP medication. See PCP as planned

## 2024-03-23 NOTE — ED Triage Notes (Signed)
 Patient reports she has mis placed her blood pressure medication. Just got the medication refilled and can't find it. Lisinopril -hydrochlorothiazide  20-12.5mg  once daily.

## 2024-04-17 ENCOUNTER — Encounter (HOSPITAL_BASED_OUTPATIENT_CLINIC_OR_DEPARTMENT_OTHER): Payer: Self-pay

## 2024-04-17 ENCOUNTER — Ambulatory Visit (HOSPITAL_BASED_OUTPATIENT_CLINIC_OR_DEPARTMENT_OTHER): Admitting: *Deleted

## 2024-04-17 VITALS — BP 106/70 | Ht 66.0 in | Wt 166.8 lb

## 2024-04-17 DIAGNOSIS — Z0000 Encounter for general adult medical examination without abnormal findings: Secondary | ICD-10-CM | POA: Diagnosis not present

## 2024-04-17 NOTE — Patient Instructions (Signed)
 Ms. Sandra Mckee , Thank you for taking time out of your busy schedule to complete your Annual Wellness Visit with me. I enjoyed our conversation and look forward to speaking with you again next year. I, as well as your care team,  appreciate your ongoing commitment to your health goals. Please review the following plan we discussed and let me know if I can assist you in the future. Your Game plan/ To Do List    Referrals: If you haven't heard from the office you've been referred to, please reach out to them at the phone provided.   Follow up Visits: We will see or speak with you next year for your Next Medicare AWV with our clinical staff Have you seen your provider in the last 6 months (3 months if uncontrolled diabetes)? Yes  Clinician Recommendations:  Aim for 30 minutes of exercise or brisk walking, 6-8 glasses of water, and 5 servings of fruits and vegetables each day.       This is a list of the screenings recommended for you:  Health Maintenance  Topic Date Due   Complete foot exam   Never done   Eye exam for diabetics  Never done   Colon Cancer Screening  02/16/2019   DTaP/Tdap/Td vaccine (1 - Tdap) 04/18/2024*   Pneumococcal Vaccine for age over 44 (1 of 2 - PCV) 04/18/2024*   Mammogram  04/18/2024*   DEXA scan (bone density measurement)  04/18/2024*   COVID-19 Vaccine (1) 04/18/2024*   Hepatitis C Screening  04/18/2024*   Zoster (Shingles) Vaccine (1 of 2) 04/18/2024*   Flu Shot  04/18/2024   Hemoglobin A1C  07/19/2024   Yearly kidney function blood test for diabetes  01/16/2025   Yearly kidney health urinalysis for diabetes  01/16/2025   Medicare Annual Wellness Visit  04/17/2025   Hepatitis B Vaccine  Aged Out   HPV Vaccine  Aged Out   Meningitis B Vaccine  Aged Out  *Topic was postponed. The date shown is not the original due date.     Advance Care Planning is important because it:  [x]  Makes sure you receive the medical care that is consistent with your values, goals,  and preferences  [x]  It provides guidance to your family and loved ones and reduces their decisional burden about whether or not they are making the right decisions based on your wishes.  Follow the link provided in your after visit summary or read over the paperwork we have mailed to you to help you started getting your Advance Directives in place. If you need assistance in completing these, please reach out to us  so that we can help you!

## 2024-04-17 NOTE — Progress Notes (Signed)
 Subjective:   Sandra Mckee is a 68 y.o. female who presents for Medicare Annual (Subsequent) preventive examination.  Visit Complete: Virtual I connected with  Sandra Mckee on 04/17/24 by a audio enabled telemedicine application and verified that I am speaking with the correct person using two identifiers.  Patient Location: Home  Provider Location: Office/Clinic  I discussed the limitations of evaluation and management by telemedicine. The patient expressed understanding and agreed to proceed.  Vital Signs: Because this visit was a virtual/telehealth visit, some criteria may be missing or patient reported. Any vitals not documented were not able to be obtained and vitals that have been documented are patient reported.  Patient Medicare AWV questionnaire was completed by the patient on 04/17/2024; I have confirmed that all information answered by patient is correct and no changes since this date.        Objective:    Today's Vitals   04/17/24 1319  BP: 106/70  Weight: 166 lb 12.8 oz (75.7 kg)  Height: 5' 6 (1.676 m)   Body mass index is 26.92 kg/m.     04/17/2024    1:33 PM 08/22/2018   10:16 AM 04/29/2018    1:33 PM 03/27/2018    7:21 AM 03/22/2018    8:23 AM 02/26/2018   12:23 PM  Advanced Directives  Does Patient Have a Medical Advance Directive? No No  No  No  No  No   Would patient like information on creating a medical advance directive? No - Patient declined   No - Patient declined  No - Patient declined  No - Patient declined      Data saved with a previous flowsheet row definition    Current Medications (verified) Outpatient Encounter Medications as of 04/17/2024  Medication Sig   Coenzyme Q10 (CO Q 10) 100 MG CAPS Take 100 mg by mouth as needed.   glimepiride  (AMARYL ) 2 MG tablet Take 1 tablet (2 mg total) by mouth every morning.   INOSITOL PO Take by mouth. W/ d-chiro inositol, berberine, and vitamin D3+k2. Also has other vitamins & minerals. 3  capsules daily.   lisinopril -hydrochlorothiazide  (ZESTORETIC ) 20-12.5 MG tablet Take 1 tablet by mouth daily.   milk thistle 175 MG tablet Take 175 mg by mouth as needed.   Misc Natural Products (CORTISOL PO) Take by mouth daily. Corti-Soothe w/ magnesium, ashwagandha, L-theanine, and other vitamins and minerals. 2 daily capsules   No facility-administered encounter medications on file as of 04/17/2024.    Allergies (verified) Codeine   History: Past Medical History:  Diagnosis Date   Anal fissure 1965   Anemia    Arthritis    fingers, hand, lower back   Colon cancer (HCC)    Complete hearing loss of right ear    Diabetes mellitus without complication (HCC)    Glaucoma    Hypertension    Past Surgical History:  Procedure Laterality Date   COLON RESECTION     6 inches removed   MENISCUS REPAIR Right    TUBAL LIGATION  1980   Family History  Problem Relation Age of Onset   Heart disease Mother    Dementia Mother    Colon polyps Father    Cirrhosis Father    Colon cancer Neg Hx    Esophageal cancer Neg Hx    Prostate cancer Neg Hx    Rectal cancer Neg Hx    Social History   Socioeconomic History   Marital status: Married    Spouse  name: Not on file   Number of children: 2   Years of education: Not on file   Highest education level: Not on file  Occupational History   Not on file  Tobacco Use   Smoking status: Never    Passive exposure: Never   Smokeless tobacco: Never  Vaping Use   Vaping status: Never Used  Substance and Sexual Activity   Alcohol use: Never   Drug use: Never   Sexual activity: Not on file  Other Topics Concern   Not on file  Social History Narrative   Not on file   Social Drivers of Health   Financial Resource Strain: Low Risk  (04/17/2024)   Overall Financial Resource Strain (CARDIA)    Difficulty of Paying Living Expenses: Not hard at all  Food Insecurity: No Food Insecurity (04/17/2024)   Hunger Vital Sign    Worried About  Running Out of Food in the Last Year: Never true    Ran Out of Food in the Last Year: Never true  Transportation Needs: No Transportation Needs (04/17/2024)   PRAPARE - Administrator, Civil Service (Medical): No    Lack of Transportation (Non-Medical): No  Physical Activity: Sufficiently Active (04/17/2024)   Exercise Vital Sign    Days of Exercise per Week: 7 days    Minutes of Exercise per Session: 30 min  Stress: No Stress Concern Present (04/17/2024)   [employer] of Occupational Health - Occupational Stress Questionnaire    Feeling of Stress: Only a little  Social Connections: Moderately Integrated (04/17/2024)   Social Connection and Isolation Panel    Frequency of Communication with Friends and Family: More than three times a week    Frequency of Social Gatherings with Friends and Family: Three times a week    Attends Religious Services: More than 4 times per year    Active Member of Clubs or Organizations: No    Attends Banker Meetings: Never    Marital Status: Married    Tobacco Counseling Counseling given: No   Clinical Intake:  Pre-visit preparation completed: Yes  Pain : No/denies pain     BMI - recorded: 26.92 Nutritional Status: BMI 25 -29 Overweight Diabetes: Yes CBG done?: No Did pt. bring in CBG monitor from home?: No  How often do you need to have someone help you when you read instructions, pamphlets, or other written materials from your doctor or pharmacy?: 1 - Never What is the last grade level you completed in school?: college  Interpreter Needed?: No  Information entered by :: Salma Walrond,CMA   Activities of Daily Living    04/17/2024    1:24 PM  In your present state of health, do you have any difficulty performing the following activities:  Hearing? 0  Vision? 1  Comment just for reading  Difficulty concentrating or making decisions? 0  Walking or climbing stairs? 0  Dressing or bathing? 0  Doing errands,  shopping? 0  Preparing Food and eating ? N  Using the Toilet? N  In the past six months, have you accidently leaked urine? N  Do you have problems with loss of bowel control? N  Managing your Medications? N  Managing your Finances? N  Housekeeping or managing your Housekeeping? N    Patient Care Team: Rothfuss, Jacob T, PA-C as PCP - General (Physician Assistant)  Indicate any recent Medical Services you may have received from other than [facility] providers in the past year (date may be approximate).  Assessment:   This is a routine wellness examination for Sandra Mckee.  Hearing/Vision screen Hearing Screening - Comments:: Pt. Do not wear hearing aids. Vision Screening - Comments:: Pt. Wear glasses for reading.   Goals Addressed               This Visit's Progress     Patient Stated (pt-stated)        Pt. Stated to manage her blood sugar.       Depression Screen    04/17/2024    1:31 PM 01/17/2024    8:57 AM  PHQ 2/9 Scores  PHQ - 2 Score 0 0    Fall Risk    04/17/2024    1:35 PM 01/17/2024    8:57 AM  Fall Risk   Falls in the past year? 0 0  Number falls in past yr: 0 0  Injury with Fall? 0 0  Risk for fall due to : No Fall Risks No Fall Risks  Follow up Falls evaluation completed Falls evaluation completed;Education provided;Falls prevention discussed    MEDICARE RISK AT HOME: Medicare Risk at Home Any stairs in or around the home?: No If so, are there any without handrails?: No Home free of loose throw rugs in walkways, pet beds, electrical cords, etc?: Yes Adequate lighting in your home to reduce risk of falls?: Yes Life alert?: No Use of a cane, walker or w/c?: No Grab bars in the bathroom?: Yes Shower chair or bench in shower?: Yes Elevated toilet seat or a handicapped toilet?: Yes  TIMED UP AND GO:  Was the test performed?  No    Cognitive Function:        04/17/2024    1:36 PM  6CIT Screen  What Year? 0 points  What month? 0 points  What  time? 3 points  Count back from 20 0 points  Months in reverse 0 points  Repeat phrase 0 points  Total Score 3 points    Immunizations  There is no immunization history on file for this patient.  TDAP status: Due, Education has been provided regarding the importance of this vaccine. Advised may receive this vaccine at local pharmacy or Health Dept. Aware to provide a copy of the vaccination record if obtained from local pharmacy or Health Dept. Verbalized acceptance and understanding.  Flu Vaccine status: Declined, Education has been provided regarding the importance of this vaccine but patient still declined. Advised may receive this vaccine at local pharmacy or Health Dept. Aware to provide a copy of the vaccination record if obtained from local pharmacy or Health Dept. Verbalized acceptance and understanding.  Pneumococcal vaccine status: Declined,  Education has been provided regarding the importance of this vaccine but patient still declined. Advised may receive this vaccine at local pharmacy or Health Dept. Aware to provide a copy of the vaccination record if obtained from local pharmacy or Health Dept. Verbalized acceptance and understanding.   Covid-19 vaccine status: Declined, Education has been provided regarding the importance of this vaccine but patient still declined. Advised may receive this vaccine at local pharmacy or Health Dept.or vaccine clinic. Aware to provide a copy of the vaccination record if obtained from local pharmacy or Health Dept. Verbalized acceptance and understanding.  Qualifies for Shingles Vaccine? Yes   Zostavax completed No   Shingrix Completed?: No.    Education has been provided regarding the importance of this vaccine. Patient has been advised to call insurance company to determine out of pocket expense if they have  not yet received this vaccine. Advised may also receive vaccine at local pharmacy or Health Dept. Verbalized acceptance and  understanding.  Screening Tests Health Maintenance  Topic Date Due   FOOT EXAM  Never done   OPHTHALMOLOGY EXAM  Never done   Colonoscopy  02/16/2019   DTaP/Tdap/Td (1 - Tdap) 04/18/2024 (Originally 02/10/1973)   Pneumococcal Vaccine: 50+ Years (1 of 2 - PCV) 04/18/2024 (Originally 02/10/1973)   MAMMOGRAM  04/18/2024 (Originally 02/11/2004)   DEXA SCAN  04/18/2024 (Originally 02/11/2019)   COVID-19 Vaccine (1) 04/18/2024 (Originally 02/11/1959)   Hepatitis C Screening  04/18/2024 (Originally 02/11/1972)   Zoster Vaccines- Shingrix (1 of 2) 04/18/2024 (Originally 02/10/1973)   INFLUENZA VACCINE  04/18/2024   HEMOGLOBIN A1C  07/19/2024   Diabetic kidney evaluation - eGFR measurement  01/16/2025   Diabetic kidney evaluation - Urine ACR  01/16/2025   Medicare Annual Wellness (AWV)  04/17/2025   Hepatitis B Vaccines  Aged Out   HPV VACCINES  Aged Out   Meningococcal B Vaccine  Aged Out    Health Maintenance  Health Maintenance Due  Topic Date Due   FOOT EXAM  Never done   OPHTHALMOLOGY EXAM  Never done   Colonoscopy  02/16/2019    Colorectal cancer screening: Type of screening: Colonoscopy. Completed 02/15/2018. Repeat every 5 years  Mammogram status: No longer required due to pt. No longer getting mammograms.  Bone Density: no longer needing to be performed.  Lung Cancer Screening: (Low Dose CT Chest recommended if Age 25-80 years, 20 pack-year currently smoking OR have quit w/in 15years.) does not qualify.   Lung Cancer Screening Referral: no  Additional Screening:  Hepatitis C Screening: does not qualify; Completed no  Vision Screening: Recommended annual ophthalmology exams for early detection of glaucoma and other disorders of the eye. Is the patient up to date with their annual eye exam?  Yes  Who is the provider or what is the name of the office in which the patient attends annual eye exams? Dr. Vannie, 2025 If pt is not established with a provider, would they like to be  referred to a provider to establish care? No .   Dental Screening: Recommended annual dental exams for proper oral hygiene  Diabetic Foot Exam: Diabetic Foot Exam: Completed 2024  Community Resource Referral / Chronic Care Management: CRR required this visit?  No   CCM required this visit?  No     Plan:     I have personally reviewed and noted the following in the patient's chart:   Medical and social history Use of alcohol, tobacco or illicit drugs  Current medications and supplements including opioid prescriptions. Patient is not currently taking opioid prescriptions. Functional ability and status Nutritional status Physical activity Advanced directives List of other physicians Hospitalizations, surgeries, and ER visits in previous 12 months Vitals Screenings to include cognitive, depression, and falls Referrals and appointments  In addition, I have reviewed and discussed with patient certain preventive protocols, quality metrics, and best practice recommendations. A written personalized care plan for preventive services as well as general preventive health recommendations were provided to patient.     Joshua Shane Lover, CMA   04/17/2024   After Visit Summary: (Declined) Due to this being a telephonic visit, with patients personalized plan was offered to patient but patient Declined AVS at this time   Nurse Notes: none

## 2024-04-18 ENCOUNTER — Encounter (HOSPITAL_BASED_OUTPATIENT_CLINIC_OR_DEPARTMENT_OTHER): Payer: Self-pay | Admitting: Student

## 2024-04-18 ENCOUNTER — Ambulatory Visit (HOSPITAL_BASED_OUTPATIENT_CLINIC_OR_DEPARTMENT_OTHER): Admitting: Student

## 2024-04-18 ENCOUNTER — Encounter (HOSPITAL_BASED_OUTPATIENT_CLINIC_OR_DEPARTMENT_OTHER): Payer: Self-pay

## 2024-04-18 VITALS — BP 137/86 | HR 75 | Temp 98.7°F | Resp 16 | Ht 66.0 in | Wt 174.7 lb

## 2024-04-18 DIAGNOSIS — E785 Hyperlipidemia, unspecified: Secondary | ICD-10-CM | POA: Diagnosis not present

## 2024-04-18 DIAGNOSIS — E119 Type 2 diabetes mellitus without complications: Secondary | ICD-10-CM

## 2024-04-18 DIAGNOSIS — I10 Essential (primary) hypertension: Secondary | ICD-10-CM

## 2024-04-18 DIAGNOSIS — E1169 Type 2 diabetes mellitus with other specified complication: Secondary | ICD-10-CM | POA: Diagnosis not present

## 2024-04-18 MED ORDER — LISINOPRIL-HYDROCHLOROTHIAZIDE 20-12.5 MG PO TABS: 1.0000 | ORAL_TABLET | Freq: Every day | ORAL | 2 refills | Status: AC

## 2024-04-18 NOTE — Patient Instructions (Signed)
 It was nice to see you today!  If you have any problems before your next visit feel free to message me via MyChart (minor issues or questions) or call the office, otherwise you may reach out to schedule an office visit.  Thank you! Pau Banh, PA-C

## 2024-04-18 NOTE — Assessment & Plan Note (Addendum)
 Chronic issue not at goal.  Have discussed in the past, patient denied statins. -Discussed possible alternative medications such as bempedoic acid or Zetia in the future. - Work on a low-fat diet. - Order lipid panel today.

## 2024-04-18 NOTE — Progress Notes (Signed)
 Established Patient Office Visit  Subjective   Patient ID: Sandra Mckee, female    DOB: 07-13-1956  Age: 68 y.o. MRN: [id number]  Chief Complaint  Patient presents with   Medical Management of Chronic Issues    Follow up. Needs refills.    HPI  Discussed the use of AI scribe software for clinical note transcription with the patient, who gave verbal consent to proceed.  History of Present Illness   Sandra Mckee is a 68 year old female with type 2 diabetes and hypertension who presents for a follow-up visit and medication refills.  Her type 2 diabetes management has been challenging due to poorly controlled diet, particularly over the summer with increased carbohydrate intake, such as tomato sandwiches. Blood sugar levels have been inconsistent, with some days being good and others not. She is currently taking Amaryl  (glimepiride ) at a dose of one tablet every morning.  She reports that she does not eat any sweets.  She requires a refill of Zestoretic  for her hypertension. She completed a month-long blood pressure study and was surprised by the good results, despite occasional stress-related spikes. She monitors her blood pressure at home, which she finds to be more accurate than readings taken in the office.  She had an eye exam and a procedure to remove clouding from her lens following cataract surgery within the past year. She sees Dr. Vannie for her eye care.  She has a history of colon cancer, for which she had a colonoscopy in 2019. She is vigilant about monitoring her symptoms due to her past experience with colon cancer.  Discussed possible follow-up with GI.  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 This condition affects her choice of footwear, as certain shoes cause discomfort due to a bony protrusion on her heel. No pain from this condition unless the area is bumped.  She does not consume alcohol, citing her father's  history of alcoholism as a reason for abstaining.      Patient Active Problem List   Diagnosis Date Noted   Primary hypertension 04/18/2024   Hyperlipidemia associated with type 2 diabetes mellitus (HCC) 04/18/2024   Dysuria 01/17/2024   Type 2 diabetes mellitus without complication, without long-term current use of insulin (HCC) 01/17/2024   Encounter for lipid screening for cardiovascular disease 01/17/2024   Colon cancer (HCC) 03/27/2018   Past Medical History:  Diagnosis Date   Anal fissure 1965   Anemia    Arthritis    fingers, hand, lower back   Colon cancer (HCC)    Complete hearing loss of right ear    Diabetes mellitus without complication (HCC)    Glaucoma    Hypertension    Social History   Tobacco Use   Smoking status: Never    Passive exposure: Never   Smokeless tobacco: Never  Vaping Use   Vaping status: Never Used  Substance Use Topics   Alcohol use: Never   Drug use: Never   Allergies  Allergen Reactions   Codeine Nausea Only      ROS Per HPI.    Objective:     BP 137/86   Pulse 75   Temp 98.7 F (37.1 C) (Oral)   Resp 16   Ht 5' 6 (1.676 m)   Wt 174 lb 11.2 oz (79.2 kg)   SpO2 97%   BMI 28.20 kg/m  BP Readings from Last 3 Encounters:  04/18/24 137/86  04/17/24 106/70  03/23/24 106/70   Wt Readings from Last 3 Encounters:  04/18/24 174 lb 11.2 oz (79.2 kg)  04/17/24 166 lb 12.8 oz (75.7 kg)  01/17/24 166 lb 8 oz (75.5 kg)      Physical Exam Constitutional:      General: She is not in acute distress.    Appearance: Normal appearance. She is not ill-appearing.  HENT:     Head: Normocephalic and atraumatic.     Nose: Nose normal.  Eyes:     General: No scleral icterus.    Conjunctiva/sclera: Conjunctivae normal.  Cardiovascular:     Rate and Rhythm: Normal rate and regular rhythm.     Heart sounds: Normal heart sounds. No murmur heard.    No friction rub.  Pulmonary:     Effort: Pulmonary effort is normal. No  respiratory distress.     Breath sounds: Normal breath sounds. No wheezing, rhonchi or rales.  Musculoskeletal:        General: Normal range of motion.     Comments: Protrusion of bony area at posterior calcaneus bilaterally without tenderness to palpation.  Skin:    General: Skin is warm and dry.     Coloration: Skin is not jaundiced or pale.  Neurological:     General: No focal deficit present.     Mental Status: She is alert.  Psychiatric:        Mood and Affect: Mood normal.        Behavior: Behavior normal.      No results found for any visits on 04/18/24.  Last CBC Lab Results  Component Value Date   WBC 6.6 01/17/2024   HGB 14.2 01/17/2024   HCT 42.8 01/17/2024   MCV 96 01/17/2024   MCH 32.0 01/17/2024   RDW 12.6 01/17/2024   PLT 226 01/17/2024   Last metabolic panel Lab Results  Component Value Date   GLUCOSE 154 (H) 01/17/2024   NA 136 01/17/2024   K 4.7 01/17/2024   CL 101 01/17/2024   CO2 18 (L) 01/17/2024   BUN 22 01/17/2024   CREATININE 0.83 01/17/2024   EGFR 76 01/17/2024   CALCIUM 9.6 01/17/2024   PROT 7.3 01/17/2024   ALBUMIN 4.8 01/17/2024   LABGLOB 2.5 01/17/2024   BILITOT 0.4 01/17/2024   ALKPHOS 87 01/17/2024   AST 28 01/17/2024   ALT 17 01/17/2024   ANIONGAP 13 03/06/2019   Last lipids Lab Results  Component Value Date   CHOL 273 (H) 01/17/2024   HDL 45 01/17/2024   LDLCALC 186 (H) 01/17/2024   TRIG 222 (H) 01/17/2024   CHOLHDL 6.1 (H) 01/17/2024   Last hemoglobin A1c Lab Results  Component Value Date   HGBA1C 7.7 (H) 01/17/2024      The 10-year ASCVD risk score (Arnett DK, et al., 2019) is: 29%    Assessment & Plan:   Primary hypertension Assessment & Plan: Chronic, stable.Home blood pressure monitoring shows good control with occasional stress-related elevations. - Prescribe 90-day supply of Zestoretic  - Continue home blood pressure monitoring - Assessed with labs today.  Orders: -      Lisinopril -hydroCHLOROthiazide ; Take 1 tablet by mouth daily.  Dispense: 90 tablet; Refill: 2 -     CBC with Differential/Platelet -     Comprehensive metabolic panel with GFR  Type 2 diabetes mellitus without complication, without long-term current use of insulin (HCC) Assessment & Plan: Chronic, not at goal based on last A1c.Type 2 diabetes mellitus with suboptimal control. Current regimen includes glimepiride  (Amaryl )  delete that.  Reports dietary non-compliance, particularly with carbohydrate intake, contributing to poor glycemic control. A1c needs re-evaluation. Discussed potential addition of Jardiance, noting husband's adverse reaction, including itchiness and yeast infections. Jardiance offers cardiac and renal protection but requires insurance verification. - Order A1c test to assess current glycemic control - Increase glimepiride  (Amaryl ) to 4 mg daily if A1c remains elevated - Consider Jardiance if further glycemic control is needed, considering adverse reactions and insurance coverage - Encourage dietary modifications to reduce carbohydrate intake   Orders: -     Hemoglobin A1c  Hyperlipidemia associated with type 2 diabetes mellitus (HCC) Assessment & Plan: Chronic issue not at goal.  Have discussed in the past, patient denied statins. -Discussed possible alternative medications such as bempedoic acid or Zetia in the future. - Work on a low-fat diet. - Order lipid panel today.  Orders: -     Lipid panel   Return in about 3 months (around 07/19/2024) for DM.    Graciela Plato T Shirle Provencal, PA-C

## 2024-04-18 NOTE — Assessment & Plan Note (Addendum)
 Chronic, stable.Home blood pressure monitoring shows good control with occasional stress-related elevations. - Prescribe 90-day supply of Zestoretic  - Continue home blood pressure monitoring - Assessed with labs today.

## 2024-04-18 NOTE — Assessment & Plan Note (Addendum)
 Chronic, not at goal based on last A1c.Type 2 diabetes mellitus with suboptimal control. Current regimen includes glimepiride  (Amaryl ) delete that.  Reports dietary non-compliance, particularly with carbohydrate intake, contributing to poor glycemic control. A1c needs re-evaluation. Discussed potential addition of Jardiance, noting husband's adverse reaction, including itchiness and yeast infections. Jardiance offers cardiac and renal protection but requires insurance verification. - Order A1c test to assess current glycemic control - Increase glimepiride  (Amaryl ) to 4 mg daily if A1c remains elevated - Consider Jardiance if further glycemic control is needed, considering adverse reactions and insurance coverage - Encourage dietary modifications to reduce carbohydrate intake

## 2024-04-19 ENCOUNTER — Ambulatory Visit (HOSPITAL_BASED_OUTPATIENT_CLINIC_OR_DEPARTMENT_OTHER): Payer: Self-pay | Admitting: Student

## 2024-04-19 DIAGNOSIS — E119 Type 2 diabetes mellitus without complications: Secondary | ICD-10-CM

## 2024-04-19 LAB — CBC WITH DIFFERENTIAL/PLATELET
Basophils Absolute: 0 x10E3/uL (ref 0.0–0.2)
Basos: 0 %
EOS (ABSOLUTE): 0.2 x10E3/uL (ref 0.0–0.4)
Eos: 4 %
Hematocrit: 43.7 % (ref 34.0–46.6)
Hemoglobin: 14.5 g/dL (ref 11.1–15.9)
Immature Grans (Abs): 0 x10E3/uL (ref 0.0–0.1)
Immature Granulocytes: 0 %
Lymphocytes Absolute: 1.2 x10E3/uL (ref 0.7–3.1)
Lymphs: 23 %
MCH: 32.2 pg (ref 26.6–33.0)
MCHC: 33.2 g/dL (ref 31.5–35.7)
MCV: 97 fL (ref 79–97)
Monocytes Absolute: 0.6 x10E3/uL (ref 0.1–0.9)
Monocytes: 11 %
Neutrophils Absolute: 3.3 x10E3/uL (ref 1.4–7.0)
Neutrophils: 62 %
Platelets: 252 x10E3/uL (ref 150–450)
RBC: 4.5 x10E6/uL (ref 3.77–5.28)
RDW: 13 % (ref 11.7–15.4)
WBC: 5.4 x10E3/uL (ref 3.4–10.8)

## 2024-04-19 LAB — COMPREHENSIVE METABOLIC PANEL WITH GFR
ALT: 20 IU/L (ref 0–32)
AST: 39 IU/L (ref 0–40)
Albumin: 4.7 g/dL (ref 3.9–4.9)
Alkaline Phosphatase: 108 IU/L (ref 44–121)
BUN/Creatinine Ratio: 21 (ref 12–28)
BUN: 18 mg/dL (ref 8–27)
Bilirubin Total: 0.4 mg/dL (ref 0.0–1.2)
CO2: 19 mmol/L — ABNORMAL LOW (ref 20–29)
Calcium: 9.7 mg/dL (ref 8.7–10.3)
Chloride: 98 mmol/L (ref 96–106)
Creatinine, Ser: 0.85 mg/dL (ref 0.57–1.00)
Globulin, Total: 2.6 g/dL (ref 1.5–4.5)
Glucose: 247 mg/dL — ABNORMAL HIGH (ref 70–99)
Potassium: 5.2 mmol/L (ref 3.5–5.2)
Sodium: 136 mmol/L (ref 134–144)
Total Protein: 7.3 g/dL (ref 6.0–8.5)
eGFR: 74 mL/min/1.73 (ref >59)

## 2024-04-19 LAB — HEMOGLOBIN A1C
Est. average glucose Bld gHb Est-mCnc: 217 mg/dL
Hgb A1c MFr Bld: 9.2 % — ABNORMAL HIGH (ref 4.8–5.6)

## 2024-04-19 LAB — LIPID PANEL
Chol/HDL Ratio: 5.4 ratio — ABNORMAL HIGH (ref 0.0–4.4)
Cholesterol, Total: 236 mg/dL — ABNORMAL HIGH (ref 100–199)
HDL: 44 mg/dL (ref >39)
LDL Chol Calc (NIH): 146 mg/dL — ABNORMAL HIGH (ref 0–99)
Triglycerides: 254 mg/dL — ABNORMAL HIGH (ref 0–149)
VLDL Cholesterol Cal: 46 mg/dL — ABNORMAL HIGH (ref 5–40)

## 2024-04-19 MED ORDER — GLIMEPIRIDE 4 MG PO TABS: 4.0000 mg | ORAL_TABLET | Freq: Every day | ORAL | 3 refills | Status: DC

## 2024-05-09 ENCOUNTER — Other Ambulatory Visit (HOSPITAL_BASED_OUTPATIENT_CLINIC_OR_DEPARTMENT_OTHER): Payer: Self-pay | Admitting: Student

## 2024-05-09 DIAGNOSIS — E119 Type 2 diabetes mellitus without complications: Secondary | ICD-10-CM

## 2024-07-18 ENCOUNTER — Encounter (HOSPITAL_BASED_OUTPATIENT_CLINIC_OR_DEPARTMENT_OTHER): Payer: Self-pay | Admitting: Student

## 2024-07-18 ENCOUNTER — Telehealth (HOSPITAL_BASED_OUTPATIENT_CLINIC_OR_DEPARTMENT_OTHER): Payer: Self-pay | Admitting: Student

## 2024-07-18 ENCOUNTER — Ambulatory Visit (INDEPENDENT_AMBULATORY_CARE_PROVIDER_SITE_OTHER): Admitting: Student

## 2024-07-18 VITALS — BP 109/74 | HR 81 | Ht 66.0 in | Wt 171.0 lb

## 2024-07-18 DIAGNOSIS — E119 Type 2 diabetes mellitus without complications: Secondary | ICD-10-CM | POA: Diagnosis not present

## 2024-07-18 DIAGNOSIS — E1169 Type 2 diabetes mellitus with other specified complication: Secondary | ICD-10-CM

## 2024-07-18 DIAGNOSIS — E785 Hyperlipidemia, unspecified: Secondary | ICD-10-CM

## 2024-07-18 DIAGNOSIS — I10 Essential (primary) hypertension: Secondary | ICD-10-CM

## 2024-07-18 LAB — LIPID PANEL
Chol/HDL Ratio: 5.7 ratio — ABNORMAL HIGH (ref 0.0–4.4)
Cholesterol, Total: 286 mg/dL — ABNORMAL HIGH (ref 100–199)
HDL: 50 mg/dL (ref >39)
LDL Chol Calc (NIH): 190 mg/dL — ABNORMAL HIGH (ref 0–99)
Triglycerides: 239 mg/dL — ABNORMAL HIGH (ref 0–149)
VLDL Cholesterol Cal: 46 mg/dL — ABNORMAL HIGH (ref 5–40)

## 2024-07-18 LAB — COMPREHENSIVE METABOLIC PANEL WITH GFR
ALT: 23 IU/L (ref 0–32)
AST: 44 IU/L — ABNORMAL HIGH (ref 0–40)
Albumin: 4.8 g/dL (ref 3.9–4.9)
Alkaline Phosphatase: 71 IU/L (ref 49–135)
BUN/Creatinine Ratio: 30 — ABNORMAL HIGH (ref 12–28)
BUN: 30 mg/dL — ABNORMAL HIGH (ref 8–27)
Bilirubin Total: 0.4 mg/dL (ref 0.0–1.2)
CO2: 18 mmol/L — ABNORMAL LOW (ref 20–29)
Calcium: 10.1 mg/dL (ref 8.7–10.3)
Chloride: 99 mmol/L (ref 96–106)
Creatinine, Ser: 1 mg/dL (ref 0.57–1.00)
Globulin, Total: 3 g/dL (ref 1.5–4.5)
Glucose: 196 mg/dL — ABNORMAL HIGH (ref 70–99)
Potassium: 4.7 mmol/L (ref 3.5–5.2)
Sodium: 136 mmol/L (ref 134–144)
Total Protein: 7.8 g/dL (ref 6.0–8.5)
eGFR: 61 mL/min/1.73 (ref >59)

## 2024-07-18 LAB — CBC WITH DIFFERENTIAL/PLATELET
Basophils Absolute: 0 x10E3/uL (ref 0.0–0.2)
Basos: 1 %
EOS (ABSOLUTE): 0.2 x10E3/uL (ref 0.0–0.4)
Eos: 2 %
Hematocrit: 47.1 % — ABNORMAL HIGH (ref 34.0–46.6)
Hemoglobin: 15.6 g/dL (ref 11.1–15.9)
Immature Grans (Abs): 0 x10E3/uL (ref 0.0–0.1)
Immature Granulocytes: 0 %
Lymphocytes Absolute: 1.6 x10E3/uL (ref 0.7–3.1)
Lymphs: 21 %
MCH: 32.4 pg (ref 26.6–33.0)
MCHC: 33.1 g/dL (ref 31.5–35.7)
MCV: 98 fL — ABNORMAL HIGH (ref 79–97)
Monocytes Absolute: 0.6 x10E3/uL (ref 0.1–0.9)
Monocytes: 8 %
Neutrophils Absolute: 5 x10E3/uL (ref 1.4–7.0)
Neutrophils: 68 %
Platelets: 254 x10E3/uL (ref 150–450)
RBC: 4.82 x10E6/uL (ref 3.77–5.28)
RDW: 12.4 % (ref 11.7–15.4)
WBC: 7.3 x10E3/uL (ref 3.4–10.8)

## 2024-07-18 LAB — POCT GLYCOSYLATED HEMOGLOBIN (HGB A1C): Hemoglobin A1C: 8.6 % — AB (ref 4.0–5.6)

## 2024-07-18 MED ORDER — RYBELSUS 3 MG PO TABS: 3.0000 mg | ORAL_TABLET | Freq: Every day | ORAL | 0 refills | Status: DC

## 2024-07-18 MED ORDER — LANCET DEVICE MISC: 1.0000 | 0 refills | Status: AC

## 2024-07-18 MED ORDER — BLOOD GLUCOSE MONITORING SUPPL DEVI: 1.0000 | 0 refills | Status: AC

## 2024-07-18 MED ORDER — LANCETS MISC: 1.0000 | 99 refills | Status: AC

## 2024-07-18 MED ORDER — BLOOD GLUCOSE TEST VI STRP: 1.0000 | ORAL_STRIP | 99 refills | Status: AC

## 2024-07-18 NOTE — Patient Instructions (Signed)
 It was nice to see you today!  I will call in the 7mg  tablet of rybelsus for your second month.  If you have any problems before your next visit feel free to message me via MyChart (minor issues or questions) or call the office, otherwise you may reach out to schedule an office visit.  Thank you! Sorayah Schrodt, PA-C

## 2024-07-18 NOTE — Progress Notes (Signed)
 Established Patient Office Visit  Subjective   Patient ID: Sandra Mckee, female    DOB: August 23, 1956  Age: 68 y.o. MRN: [id number]  Chief Complaint  Patient presents with   Diabetes    HPI  Discussed the use of AI scribe software for clinical note transcription with the patient, who gave verbal consent to proceed.  History of Present Illness   Sandra Mckee is a 68 year old female with type 2 diabetes who presents for management of her diabetes.  She reports her hemoglobin A1c was previously 9.2% and is now 8.6%. She takes Amaryl  (glimepiride ) 4 mg daily and has not experienced hypoglycemia, with the lowest blood sugar reading being 118 mg/dL. She needs a new finger prick device as her current one is damaged.  She has been informed about newer diabetes medications like Jardiance and Farxiga but prefers to avoid them due to a history of yeast infections. She has also declined metformin.  She reports high fasting blood sugar levels in the morning despite not eating after 4 or 5 PM, which concerns her regarding her diabetes management.  Her past medical history includes colon cancer, treated with surgical resection without chemotherapy. She has not experienced gastrointestinal symptoms such as constipation, diarrhea, or blood in the stool recently.  She underwent cataract surgery on both eyes either late last year or early this year and is due for an annual eye exam. She reports she has never had a foot exam for diabetes-related complications.  She has not received a shingles vaccine and is hesitant about vaccinations in general. She believes she is up to date on her tetanus vaccine, having received it within the last ten years.      Patient Active Problem List   Diagnosis Date Noted   Primary hypertension 04/18/2024   Hyperlipidemia associated with type 2 diabetes mellitus (HCC) 04/18/2024   Dysuria 01/17/2024   Type 2 diabetes mellitus without complication, without  long-term current use of insulin (HCC) 01/17/2024   Encounter for lipid screening for cardiovascular disease 01/17/2024   Colon cancer (HCC) 03/27/2018   Past Medical History:  Diagnosis Date   Anal fissure 1965   Anemia    Arthritis    fingers, hand, lower back   Colon cancer (HCC)    Complete hearing loss of right ear    Diabetes mellitus without complication (HCC)    Glaucoma    Hypertension    Social History   Tobacco Use   Smoking status: Never    Passive exposure: Never   Smokeless tobacco: Never  Vaping Use   Vaping status: Never Used  Substance Use Topics   Alcohol use: Never   Drug use: Never   Allergies  Allergen Reactions   Codeine Nausea Only      ROS Per HPI.    Objective:     BP 109/74   Pulse 81   Ht 5' 6 (1.676 m)   Wt 171 lb 0.4 oz (77.6 kg)   SpO2 95%   BMI 27.60 kg/m  BP Readings from Last 3 Encounters:  07/18/24 109/74  04/18/24 137/86  04/17/24 106/70   Wt Readings from Last 3 Encounters:  07/18/24 171 lb 0.4 oz (77.6 kg)  04/18/24 174 lb 11.2 oz (79.2 kg)  04/17/24 166 lb 12.8 oz (75.7 kg)      Physical Exam Constitutional:      General: She is not in acute distress.    Appearance: Normal appearance. She is not ill-appearing.  HENT:     Head: Normocephalic and atraumatic.     Nose: Nose normal.  Eyes:     General: No scleral icterus.    Conjunctiva/sclera: Conjunctivae normal.  Cardiovascular:     Rate and Rhythm: Normal rate and regular rhythm.     Heart sounds: Normal heart sounds. No murmur heard.    No friction rub.  Pulmonary:     Effort: Pulmonary effort is normal. No respiratory distress.     Breath sounds: Normal breath sounds. No wheezing, rhonchi or rales.  Musculoskeletal:        General: Normal range of motion.     Right foot: No deformity or Charcot foot.     Left foot: No deformity or Charcot foot.  Feet:     Right foot:     Protective Sensation: 9 sites tested.  9 sites sensed.     Skin  integrity: Skin integrity normal. No ulcer, skin breakdown, erythema or warmth.     Toenail Condition: Right toenails are normal.     Left foot:     Protective Sensation: 9 sites tested.  9 sites sensed.     Skin integrity: Skin integrity normal. No ulcer, skin breakdown, erythema or warmth.     Toenail Condition: Left toenails are normal.     Comments: DP pulse normal Skin:    General: Skin is warm and dry.     Coloration: Skin is not jaundiced or pale.  Neurological:     General: No focal deficit present.     Mental Status: She is alert.  Psychiatric:        Mood and Affect: Mood normal.        Behavior: Behavior normal.      Results for orders placed or performed in visit on 07/18/24  POCT HgB A1C  Result Value Ref Range   Hemoglobin A1C 8.6 (A) 4.0 - 5.6 %   HbA1c POC (<> result, manual entry)     HbA1c, POC (prediabetic range)     HbA1c, POC (controlled diabetic range)      Last CBC Lab Results  Component Value Date   WBC 5.4 04/18/2024   HGB 14.5 04/18/2024   HCT 43.7 04/18/2024   MCV 97 04/18/2024   MCH 32.2 04/18/2024   RDW 13.0 04/18/2024   PLT 252 04/18/2024   Last metabolic panel Lab Results  Component Value Date   GLUCOSE 247 (H) 04/18/2024   NA 136 04/18/2024   K 5.2 04/18/2024   CL 98 04/18/2024   CO2 19 (L) 04/18/2024   BUN 18 04/18/2024   CREATININE 0.85 04/18/2024   EGFR 74 04/18/2024   CALCIUM 9.7 04/18/2024   PROT 7.3 04/18/2024   ALBUMIN 4.7 04/18/2024   LABGLOB 2.6 04/18/2024   BILITOT 0.4 04/18/2024   ALKPHOS 108 04/18/2024   AST 39 04/18/2024   ALT 20 04/18/2024   ANIONGAP 13 03/06/2019   Last lipids Lab Results  Component Value Date   CHOL 236 (H) 04/18/2024   HDL 44 04/18/2024   LDLCALC 146 (H) 04/18/2024   TRIG 254 (H) 04/18/2024   CHOLHDL 5.4 (H) 04/18/2024   Last hemoglobin A1c Lab Results  Component Value Date   HGBA1C 8.6 (A) 07/18/2024    The 10-year ASCVD risk score (Arnett DK, et al., 2019) is: 18.7%     Assessment & Plan:   Assessment and Plan    Type 2 diabetes mellitus Chronic, not at goal. ype 2 diabetes mellitus with improved control,  A1c decreased from 9.2% to 8.6%. Current treatment with glimepiride  4 mg is well tolerated without hypoglycemia. She prefers to avoid SGLT2 inhibitors like Jardiance due to risk of yeast infections. No family history of MEN1, MEN2, or medullary thyroid cancer. - Prescribe Rybelsus starting at 3 mg, titrate to 7 mg after one month if tolerated. - Continue glimepiride  4 mg daily. - Provide prescription for new glucose monitoring equipment. - Order A1c test in approximately three months. - Foot exam benign.  Hx of Colon cancer Colon cancer with surgical resection 5.5 years ago. No current GI symptoms such as constipation, diarrhea, or blood in stool. She prefers to avoid colonoscopy due to age. - Contact previous oncologist for recommendations on colon cancer screening. - Consider Cologuard as an alternative to colonoscopy.  Hypertension Chronic, stable. - Continue current regimen.  Hyperlipidemia Chronic, denied statins in the past. - Assess lipids today.  General Health Maintenance Discussed general health maintenance including vaccinations and screenings. She declined shingles vaccine and mammogram. Tetanus vaccination status is uncertain but likely up to date. Discussed the importance of bone density screening, but she declined due to perceived low risk. Eye exam completed earlier this year, awaiting records. - Obtain records of recent eye exam. - Document refusal of mammogram and shingles vaccine.         Return in about 5 months (around 12/16/2024) for DM.    Brinklee Cisse T Uri Covey, PA-C

## 2024-07-18 NOTE — Telephone Encounter (Signed)
 Copied from CRM #[id number]. Topic: Clinical - Prescription Issue >> Jul 18, 2024  3:01 PM Willma R wrote: Reason for CRM: Patient states went to the pharmacy to pick up the prescription for Semaglutide (RYBELSUS) 3 MG TABS. States they advised over $245 after her insurance. Patient states she cannot afford it. Wants to know if there are any other options.  Patient can be reached at [phone]

## 2024-07-21 NOTE — Telephone Encounter (Signed)
 Pt said no. Husband tried it and he did not like it. Pt said if there is nothing else she can take she will just go without an alternative.

## 2024-07-22 MED ORDER — SITAGLIPTIN PHOSPHATE 100 MG PO TABS: 100.0000 mg | ORAL_TABLET | Freq: Every day | ORAL | 6 refills | Status: AC

## 2024-07-22 NOTE — Addendum Note (Signed)
 Addended by: Marylouise Mallet on: 07/22/2024 03:24 PM   Modules accepted: Orders

## 2024-07-24 NOTE — Telephone Encounter (Signed)
 Spoke to pt and she was told by pharmacy they will be ordering it. She stated she does not understand why she has to take another medication with more side effects. Stated she is old enough to have joint aches. I advised it helps prevent further worsening of diabetes complications. Pt said she would see if she tries it or not. Advised to keep us  dated if she is having any issues or concerns.

## 2024-08-13 ENCOUNTER — Other Ambulatory Visit (HOSPITAL_BASED_OUTPATIENT_CLINIC_OR_DEPARTMENT_OTHER): Payer: Self-pay | Admitting: Student

## 2024-08-13 DIAGNOSIS — E119 Type 2 diabetes mellitus without complications: Secondary | ICD-10-CM
